# Patient Record
Sex: Female | Born: 1995 | Race: White | Hispanic: Yes | Marital: Single | Smoking: Never smoker
Health system: Southern US, Community
[De-identification: ages and names within clinical notes are randomized; demographics above are authoritative.]

## PROBLEM LIST (undated history)

## (undated) ENCOUNTER — Inpatient Hospital Stay (HOSPITAL_COMMUNITY): Payer: Self-pay

## (undated) DIAGNOSIS — Z789 Other specified health status: Secondary | ICD-10-CM

## (undated) HISTORY — PX: NO PAST SURGERIES: SHX2092

---
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
## 2013-02-28 ENCOUNTER — Encounter (HOSPITAL_COMMUNITY): Payer: Self-pay

## 2013-02-28 ENCOUNTER — Emergency Department (HOSPITAL_COMMUNITY)
Admission: EM | Admit: 2013-02-28 | Discharge: 2013-03-01 | Disposition: A | Discharge status: Home / Self Care | Payer: Self-pay | Attending: Emergency Medicine | Admitting: Emergency Medicine

## 2013-02-28 DIAGNOSIS — T7840XA Allergy, unspecified, initial encounter: Secondary | ICD-10-CM

## 2013-02-28 DIAGNOSIS — Z79899 Other long term (current) drug therapy: Secondary | ICD-10-CM | POA: Insufficient documentation

## 2013-02-28 DIAGNOSIS — L500 Allergic urticaria: Secondary | ICD-10-CM | POA: Insufficient documentation

## 2013-02-28 MED ORDER — DIPHENHYDRAMINE HCL 25 MG PO CAPS: 25.0000 mg | ORAL_CAPSULE | Freq: Once | ORAL | Status: DC

## 2013-02-28 MED ORDER — DIPHENHYDRAMINE HCL 50 MG/ML IJ SOLN
INTRAMUSCULAR | Status: AC
  Administered 2013-02-28: 25 mg via INTRAVENOUS
  Filled 2013-02-28: qty 1

## 2013-02-28 MED ORDER — EPINEPHRINE 0.3 MG/0.3ML IJ DEVI: 0.3000 mg | Freq: Once | INTRAMUSCULAR | Status: DC

## 2013-02-28 MED ORDER — FAMOTIDINE IN NACL 20-0.9 MG/50ML-% IV SOLN
20.0000 mg | Freq: Once | INTRAVENOUS | Status: AC
  Administered 2013-02-28: 20 mg via INTRAVENOUS
  Filled 2013-02-28: qty 50

## 2013-02-28 MED ORDER — DIPHENHYDRAMINE HCL 50 MG/ML IJ SOLN
25.0000 mg | Freq: Once | INTRAMUSCULAR | Status: AC
  Administered 2013-02-28: 25 mg via INTRAVENOUS

## 2013-02-28 MED ORDER — EPINEPHRINE 0.15 MG/0.3ML IJ DEVI
INTRAMUSCULAR | Status: AC
  Filled 2013-02-28: qty 0.3

## 2013-02-28 MED ORDER — METHYLPREDNISOLONE SODIUM SUCC 125 MG IJ SOLR
125.0000 mg | Freq: Once | INTRAMUSCULAR | Status: AC
  Administered 2013-02-28: 125 mg via INTRAVENOUS
  Filled 2013-02-28: qty 2

## 2013-02-28 MED ORDER — SODIUM CHLORIDE 0.9 % IV BOLUS (SEPSIS)
1000.0000 mL | Freq: Once | INTRAVENOUS | Status: AC
  Administered 2013-02-28: 1000 mL via INTRAVENOUS

## 2013-02-28 NOTE — ED Notes (Addendum)
Hives have decreased greatly since administration of benadryl. Pt is in no distress. Pt placed on pulse ox and BP cuff. Denies sob or feeling like throat is swollen.

## 2013-02-28 NOTE — ED Notes (Signed)
Pt presents to ED with hives to her upper torso, upper back, BUE, neck and face. Pt reports eating shrimp approx 1330 today, has eaten shrimp in the past and never had am allergic reaction

## 2013-03-01 MED ORDER — FAMOTIDINE 20 MG PO TABS: 20.0000 mg | ORAL_TABLET | Freq: Two times a day (BID) | ORAL | Status: DC

## 2013-03-01 MED ORDER — PREDNISONE 50 MG PO TABS: 50.0000 mg | ORAL_TABLET | Freq: Every day | ORAL | Status: DC

## 2013-03-01 NOTE — ED Provider Notes (Signed)
History     CSN: [id number]  Arrival date & time 02/28/13  2214   First MD Initiated Contact with Patient 02/28/13 2245      Chief Complaint  Patient presents with  . Allergic Reaction    (Consider location/radiation/quality/duration/timing/severity/associated sxs/prior treatment) HPI Patient presents emergency department with hives and itching that began just prior to arrival.  Patient, states she was doing laundry at that time.  Patient, states, that around 6:00 tonight she ate shrimp. Patient's symptoms started around 10:00pm.  Patient denies wheezing, chest pain, shortness of breath, difficulty swallowing, difficulty breathing, tongue swelling, or syncope.  Patient, states, that she did not use them prior to arrival.  History reviewed. No pertinent past medical history.  History reviewed. No pertinent past surgical history.  History reviewed. No pertinent family history.  History  Substance Use Topics  . Smoking status: Never Smoker   . Smokeless tobacco: Not on file  . Alcohol Use: No    OB History   Grav Para Term Preterm Abortions TAB SAB Ect Mult Living                  Review of Systems All other systems negative except as documented in the HPI. All pertinent positives and negatives as reviewed in the HPI.  Allergies  Review of patient's allergies indicates no known allergies.  Home Medications   Current Outpatient Rx  Name  Route  Sig  Dispense  Refill  . ferrous sulfate 325 (65 FE) MG tablet   Oral   Take 325 mg by mouth daily with breakfast.         . ibuprofen (ADVIL,MOTRIN) 200 MG tablet   Oral   Take 400 mg by mouth every 6 (six) hours as needed for pain or headache.           BP 97/59  Pulse 65  Temp(Src) 97.7 F (36.5 C) (Oral)  Resp 16  SpO2 100%  LMP 02/11/2013  Physical Exam  Nursing note and vitals reviewed. Constitutional: She is oriented to person, place, and time. She appears well-developed and well-nourished. No distress.   HENT:  Head: Normocephalic.  Mouth/Throat: Oropharynx is clear and moist.  Eyes: Pupils are equal, round, and reactive to light.  Neck: Normal range of motion.  Cardiovascular: Normal rate, regular rhythm and normal heart sounds.  Exam reveals no gallop and no friction rub.   No murmur heard. Pulmonary/Chest: Effort normal and breath sounds normal. No respiratory distress. She has no wheezes.  Neurological: She is alert and oriented to person, place, and time.  Skin: Skin is warm and dry. Rash noted.  Patient had hives covering the majority of her body. Upon my second evaluation of the patient after medication administration her rash is completely resolved.    ED Course  Procedures (including critical care time)  Patient received Solu-Medrol, Benadryl, and Pepcid and is completely resolved of all symptoms.  Patient has been rechecked x3 on each assessment no rash is noted. Patient is not having respiratory distress and advised patient to followup with her primary care Dr. Geronimo Running advised to return here for any worsening in her condition.   MDM  MDM Reviewed: nursing note and vitals            Carlyle Dolly, PA-C 03/01/13 0041

## 2013-03-01 NOTE — ED Provider Notes (Signed)
Medical screening examination/treatment/procedure(s) were performed by non-physician practitioner and as supervising physician I was immediately available for consultation/collaboration.  Addilee Neu, MD 03/01/13 0612 

## 2013-07-18 ENCOUNTER — Inpatient Hospital Stay (HOSPITAL_COMMUNITY)
Admission: AD | Admit: 2013-07-18 | Discharge: 2013-07-18 | Disposition: A | Discharge status: Home / Self Care | Payer: Self-pay | Source: Ambulatory Visit | Attending: Family Medicine | Admitting: Family Medicine

## 2013-07-18 ENCOUNTER — Encounter (HOSPITAL_COMMUNITY): Payer: Self-pay | Admitting: *Deleted

## 2013-07-18 ENCOUNTER — Inpatient Hospital Stay (HOSPITAL_COMMUNITY): Payer: Medicaid Other

## 2013-07-18 DIAGNOSIS — O34599 Maternal care for other abnormalities of gravid uterus, unspecified trimester: Secondary | ICD-10-CM | POA: Insufficient documentation

## 2013-07-18 DIAGNOSIS — O4690 Antepartum hemorrhage, unspecified, unspecified trimester: Secondary | ICD-10-CM

## 2013-07-18 DIAGNOSIS — N831 Corpus luteum cyst of ovary, unspecified side: Secondary | ICD-10-CM | POA: Insufficient documentation

## 2013-07-18 DIAGNOSIS — O209 Hemorrhage in early pregnancy, unspecified: Secondary | ICD-10-CM | POA: Insufficient documentation

## 2013-07-18 HISTORY — DX: Other specified health status: Z78.9

## 2013-07-18 LAB — POCT PREGNANCY, URINE: Preg Test, Ur: POSITIVE — AB

## 2013-07-18 LAB — URINE MICROSCOPIC-ADD ON

## 2013-07-18 LAB — URINALYSIS, ROUTINE W REFLEX MICROSCOPIC
Nitrite: NEGATIVE
Specific Gravity, Urine: 1.01 (ref 1.005–1.030)
Urobilinogen, UA: 0.2 mg/dL (ref 0.0–1.0)

## 2013-07-18 LAB — WET PREP, GENITAL
Clue Cells Wet Prep HPF POC: NONE SEEN
Trich, Wet Prep: NONE SEEN
Yeast Wet Prep HPF POC: NONE SEEN

## 2013-07-18 NOTE — MAU Provider Note (Signed)
History     CSN: [id number]  Arrival date and time: 07/18/13 2004   First Provider Initiated Contact with Patient 07/18/13 2117      Chief Complaint  Patient presents with  . Vaginal Bleeding   HPI This is a 17 y.o. female at [redacted]w[redacted]d by LMP who presents with c/o positive pregnancy test and light bleeding. Started brown yesterday and got red today. No pain.   RN Note  Pt reports 2 positive home preg test, over the last few days she had some bleeding. Started as brownish discharge and is now red and a little heavier than spotting. Denies pain.       OB History   Grav Para Term Preterm Abortions TAB SAB Ect Mult Living   2    1 1           Past Medical History  Diagnosis Date  . Medical history non-contributory     Past Surgical History  Procedure Laterality Date  . No past surgeries      No family history on file.  History  Substance Use Topics  . Smoking status: Never Smoker   . Smokeless tobacco: Not on file  . Alcohol Use: No    Allergies: No Known Allergies  Prescriptions prior to admission  Medication Sig Dispense Refill  . [DISCONTINUED] famotidine (PEPCID) 20 MG tablet Take 1 tablet (20 mg total) by mouth 2 (two) times daily.  10 tablet  0  . [DISCONTINUED] ferrous sulfate 325 (65 FE) MG tablet Take 325 mg by mouth daily with breakfast.      . [DISCONTINUED] ibuprofen (ADVIL,MOTRIN) 200 MG tablet Take 400 mg by mouth every 6 (six) hours as needed for pain or headache.      . [DISCONTINUED] predniSONE (DELTASONE) 50 MG tablet Take 1 tablet (50 mg total) by mouth daily.  5 tablet  0    Review of Systems  Constitutional: Negative for fever and malaise/fatigue.  Gastrointestinal: Negative for nausea, vomiting, abdominal pain, diarrhea and constipation.  Genitourinary:       Spotting   Neurological: Negative for dizziness and headaches.   Physical Exam   Blood pressure 111/63, pulse 75, temperature 98.5 F (36.9 C), temperature source Oral, resp. rate  18, last menstrual period 06/02/2013, SpO2 100.00%.  Physical Exam  Constitutional: She is oriented to person, place, and time. She appears well-developed and well-nourished. No distress.  HENT:  Head: Normocephalic.  Cardiovascular: Normal rate.   Respiratory: Effort normal.  GI: Soft. There is no tenderness. There is no rebound and no guarding.  Genitourinary: Uterus normal. Vaginal discharge (small to moderate blood in vault, cervix long and closed, uterus small, adnexae nontender) found.  Musculoskeletal: Normal range of motion.  Neurological: She is alert and oriented to person, place, and time.  Skin: Skin is warm and dry.  Psychiatric: She has a normal mood and affect.    MAU Course  Procedures  MDM Results for orders placed during the hospital encounter of 07/18/13 (from the past 24 hour(s))  URINALYSIS, ROUTINE W REFLEX MICROSCOPIC     Status: Abnormal   Collection Time    07/18/13  8:25 PM      Result Value Range   Color, Urine YELLOW  YELLOW   APPearance CLEAR  CLEAR   Specific Gravity, Urine 1.010  1.005 - 1.030   pH 6.0  5.0 - 8.0   Glucose, UA NEGATIVE  NEGATIVE mg/dL   Hgb urine dipstick LARGE (*) NEGATIVE   Bilirubin Urine NEGATIVE  NEGATIVE   Ketones, ur NEGATIVE  NEGATIVE mg/dL   Protein, ur NEGATIVE  NEGATIVE mg/dL   Urobilinogen, UA 0.2  0.0 - 1.0 mg/dL   Nitrite NEGATIVE  NEGATIVE   Leukocytes, UA NEGATIVE  NEGATIVE  URINE MICROSCOPIC-ADD ON     Status: Abnormal   Collection Time    07/18/13  8:25 PM      Result Value Range   Squamous Epithelial / LPF FEW (*) RARE   WBC, UA 0-2  <3 WBC/hpf   RBC / HPF 3-6  <3 RBC/hpf   Bacteria, UA FEW (*) RARE   Urine-Other RARE YEAST    POCT PREGNANCY, URINE     Status: Abnormal   Collection Time    07/18/13  8:32 PM      Result Value Range   Preg Test, Ur POSITIVE (*) NEGATIVE  HCG, QUANTITATIVE, PREGNANCY     Status: Abnormal   Collection Time    07/18/13  9:20 PM      Result Value Range   hCG, Beta  Chain, Quant, S 410 (*) <5 mIU/mL  ABO/RH     Status: None   Collection Time    07/18/13  9:20 PM      Result Value Range   ABO/RH(D) O POS    WET PREP, GENITAL     Status: Abnormal   Collection Time    07/18/13  9:25 PM      Result Value Range   Yeast Wet Prep HPF POC NONE SEEN  NONE SEEN   Trich, Wet Prep NONE SEEN  NONE SEEN   Clue Cells Wet Prep HPF POC NONE SEEN  NONE SEEN   WBC, Wet Prep HPF POC FEW (*) NONE SEEN     US Ob Transvaginal  07/18/2013   *RADIOLOGY REPORT*  Clinical Data: Vaginal bleeding.  Quantitative beta HCG is pending. By LMP, the patient is 6 weeks 4 days.  EDC by LMP is 03/09/2014.  OBSTETRIC <14 WK [place] AND TRANSVAGINAL OB US  Technique:  Both transabdominal and transvaginal ultrasound examinations were performed for complete evaluation of the gestation as well as the maternal uterus, adnexal regions, and pelvic cul-de-sac.  Transvaginal technique was performed to assess early pregnancy.  Comparison:  None.    Intrauterine gestational sac:  Present Yolk sac: Not seen Embryo: Not seen Cardiac Activity: Not seen  MSD: 4.7 mm  5 w 0 d  Maternal uterus/adnexae: No subchorionic hemorrhage identified.  Right corpus luteum cyst is 1.0 x 0.6 x 0.8 cm.  Left ovary has a normal appearance.  No free pelvic fluid.    IMPRESSION:  1.  Intrauterine sac like structure, highly likely to represent an intrauterine gestational sac.  No yolk sac or fetal pole identified at this time. 2.  Follow-up ultrasound is suggested in 10 - 14 days to assess interval growth and for dating purposes.     Original Report Authenticated By: Norva Pavlov, M.D.    Assessment and Plan  A:  Pregnancy at [redacted]w[redacted]d       Gestational sac with Quant at 410, cannot rule out ectopic yet  P:  Discharge home with ectopic precautions       Return in 48 hrs for quant and repeat US in a week  [facility] 07/18/2013, 9:34 PM

## 2013-07-18 NOTE — MAU Note (Signed)
Pt reports 2 positive home preg test, over the last few days she had some bleeding. Started as brownish discharge and is now red and a little heavier than spotting. Denies pain.

## 2013-07-19 LAB — GC/CHLAMYDIA PROBE AMP
CT Probe RNA: NEGATIVE
GC Probe RNA: NEGATIVE

## 2013-07-19 NOTE — MAU Provider Note (Signed)
Chart reviewed and agree with management and plan.  

## 2013-07-20 ENCOUNTER — Inpatient Hospital Stay (HOSPITAL_COMMUNITY)
Admission: AD | Admit: 2013-07-20 | Discharge: 2013-07-20 | Disposition: A | Discharge status: Home / Self Care | Payer: Self-pay | Source: Ambulatory Visit | Attending: Obstetrics & Gynecology | Admitting: Obstetrics & Gynecology

## 2013-07-20 ENCOUNTER — Encounter (HOSPITAL_COMMUNITY): Payer: Self-pay

## 2013-07-20 DIAGNOSIS — O039 Complete or unspecified spontaneous abortion without complication: Secondary | ICD-10-CM

## 2013-07-20 DIAGNOSIS — O034 Incomplete spontaneous abortion without complication: Secondary | ICD-10-CM | POA: Insufficient documentation

## 2013-07-20 NOTE — MAU Provider Note (Signed)
  History     CSN: [id number]  Arrival date and time: 07/20/13 1406   None     Chief Complaint  Patient presents with  . Follow-up   HPI  Pt is a G2P0010 at [redacted]w[redacted]d weeks IUP here for follow-up BHCG.  Seen on 9/9 for vaginal bleeding.  BHCG 410.  Blood type O+.  Ultrasound showed IUGS, no yolk sac.  Pt report continued bleeding.  Bleeding is described as light.  No report of pelvic pain.     Past Medical History  Diagnosis Date  . Medical history non-contributory     Past Surgical History  Procedure Laterality Date  . No past surgeries      No family history on file.  History  Substance Use Topics  . Smoking status: Never Smoker   . Smokeless tobacco: Not on file  . Alcohol Use: No    Allergies: No Known Allergies  No prescriptions prior to admission    Review of Systems  Genitourinary:       Vaginal bleeding  All other systems reviewed and are negative.   Physical Exam   Blood pressure 123/67, pulse 95, temperature 98.4 F (36.9 C), temperature source Oral, resp. rate 16, last menstrual period 06/02/2013, SpO2 99.00%.  Physical Exam  Constitutional: She is oriented to person, place, and time. She appears well-developed and well-nourished. No distress.  HENT:  Head: Normocephalic.  Neck: Normal range of motion. Neck supple.  Neurological: She is alert and oriented to person, place, and time. She has normal reflexes.  Skin: Skin is warm and dry.    MAU Course  Procedures Results for orders placed during the hospital encounter of 07/20/13 (from the past 24 hour(s))  HCG, QUANTITATIVE, PREGNANCY     Status: Abnormal   Collection Time    07/20/13  2:16 PM      Result Value Range   hCG, Beta Chain, Quant, S 104 (*) <5 mIU/mL   Consulted with Dr. Penne Lash > reviewed HPI/labs/exam  Assessment and Plan  Incomplete Miscarriage  Plan: Discharge to home Repeat BHCG in one week Ectopic precautions  [facility] 07/20/2013, 3:43 PM

## 2013-07-20 NOTE — MAU Note (Signed)
Patient to MAU for repeat BHCG. Patient states she is having light bleeding with small clots. States she has some mid to upper abdominal pain off and on. No cramping.

## 2013-07-24 NOTE — MAU Provider Note (Signed)
Attestation of Attending Supervision of Advanced Practitioner (CNM/NP): Evaluation and management procedures were performed by the Advanced Practitioner under my supervision and collaboration. I have reviewed the Advanced Practitioner's note and chart, and I agree with the management and plan.  LEGGETT,KELLY H. 4:38 PM

## 2013-07-25 ENCOUNTER — Ambulatory Visit (HOSPITAL_COMMUNITY): Payer: Self-pay | Attending: Advanced Practice Midwife

## 2013-07-27 ENCOUNTER — Other Ambulatory Visit: Payer: Self-pay

## 2013-10-04 ENCOUNTER — Encounter (HOSPITAL_COMMUNITY): Payer: Self-pay | Admitting: Emergency Medicine

## 2013-10-04 DIAGNOSIS — G43909 Migraine, unspecified, not intractable, without status migrainosus: Secondary | ICD-10-CM | POA: Insufficient documentation

## 2013-10-04 DIAGNOSIS — Z79899 Other long term (current) drug therapy: Secondary | ICD-10-CM | POA: Insufficient documentation

## 2013-10-04 DIAGNOSIS — Z3202 Encounter for pregnancy test, result negative: Secondary | ICD-10-CM | POA: Insufficient documentation

## 2013-10-04 NOTE — ED Notes (Signed)
Pt. reports persistent headache for 2 days unrelieved by OTC pain medication , nausea and vomitted twice , denies head injury. No fever or chills , denies neck stiffness.

## 2013-10-05 ENCOUNTER — Emergency Department (HOSPITAL_COMMUNITY)
Admission: EM | Admit: 2013-10-05 | Discharge: 2013-10-05 | Disposition: A | Discharge status: Home / Self Care | Payer: Medicaid Other | Attending: Emergency Medicine | Admitting: Emergency Medicine

## 2013-10-05 DIAGNOSIS — G43909 Migraine, unspecified, not intractable, without status migrainosus: Secondary | ICD-10-CM

## 2013-10-05 MED ORDER — SODIUM CHLORIDE 0.9 % IV BOLUS (SEPSIS)
1000.0000 mL | Freq: Once | INTRAVENOUS | Status: AC
  Administered 2013-10-05: 1000 mL via INTRAVENOUS

## 2013-10-05 MED ORDER — DIPHENHYDRAMINE HCL 50 MG/ML IJ SOLN
25.0000 mg | Freq: Once | INTRAMUSCULAR | Status: AC
  Administered 2013-10-05: 25 mg via INTRAVENOUS
  Filled 2013-10-05: qty 1

## 2013-10-05 MED ORDER — DEXAMETHASONE SODIUM PHOSPHATE 10 MG/ML IJ SOLN
10.0000 mg | Freq: Once | INTRAMUSCULAR | Status: AC
  Administered 2013-10-05: 10 mg via INTRAVENOUS
  Filled 2013-10-05: qty 1

## 2013-10-05 MED ORDER — METOCLOPRAMIDE HCL 5 MG/ML IJ SOLN
10.0000 mg | Freq: Once | INTRAMUSCULAR | Status: AC
  Administered 2013-10-05: 10 mg via INTRAVENOUS
  Filled 2013-10-05: qty 2

## 2013-10-05 NOTE — ED Provider Notes (Signed)
CSN: [id number]     Arrival date & time 10/04/13  2242 History   First MD Initiated Contact with Patient 10/05/13 0146     Chief Complaint  Patient presents with  . Headache   (Consider location/radiation/quality/duration/timing/severity/associated sxs/prior Treatment) Patient is a 17 y.o. female presenting with headaches.  Headache Pain location:  L temporal and frontal Quality:  Dull Severity currently:  7/10 Severity at highest:  10/10 Onset quality:  Gradual Duration:  2 days Timing:  Constant Progression:  Waxing and waning Chronicity:  New Relieved by:  Nothing Worsened by:  Sound Ineffective treatments:  Acetaminophen and NSAIDs Associated symptoms: no abdominal pain, no back pain, no blurred vision, no congestion, no cough, no diarrhea, no fatigue, no fever, no focal weakness, no loss of balance, no myalgias, no nausea, no near-syncope, no neck pain, no neck stiffness, no numbness, no paresthesias, no photophobia, no seizures, no sore throat, no visual change, no vomiting and no weakness     Past Medical History  Diagnosis Date  . Medical history non-contributory    Past Surgical History  Procedure Laterality Date  . No past surgeries     No family history on file. History  Substance Use Topics  . Smoking status: Never Smoker   . Smokeless tobacco: Not on file  . Alcohol Use: No   OB History   Grav Para Term Preterm Abortions TAB SAB Ect Mult Living   2    1 1          Review of Systems  Constitutional: Negative for fever, chills, diaphoresis, activity change, appetite change and fatigue.  HENT: Negative for congestion, facial swelling, rhinorrhea and sore throat.   Eyes: Negative for blurred vision, photophobia and discharge.  Respiratory: Negative for cough, chest tightness and shortness of breath.   Cardiovascular: Negative for chest pain, palpitations, leg swelling and near-syncope.  Gastrointestinal: Negative for nausea, vomiting, abdominal pain and  diarrhea.  Endocrine: Negative for polydipsia and polyuria.  Genitourinary: Negative for dysuria, frequency, difficulty urinating and pelvic pain.  Musculoskeletal: Negative for arthralgias, back pain, myalgias, neck pain and neck stiffness.  Skin: Negative for color change and wound.  Allergic/Immunologic: Negative for immunocompromised state.  Neurological: Positive for headaches. Negative for focal weakness, seizures, facial asymmetry, weakness, numbness, paresthesias and loss of balance.  Hematological: Does not bruise/bleed easily.  Psychiatric/Behavioral: Negative for confusion and agitation.    Allergies  Shrimp  Home Medications   Current Outpatient Rx  Name  Route  Sig  Dispense  Refill  . Ascorbic Acid (VITAMIN C PO)   Oral   Take 1 tablet by mouth daily.          BP 100/64  Pulse 74  Temp(Src) 98.2 F (36.8 C) (Oral)  Resp 15  SpO2 98%  LMP 08/20/2013 Physical Exam  Constitutional: She is oriented to person, place, and time. She appears well-developed and well-nourished. No distress.  HENT:  Head: Normocephalic and atraumatic.  Mouth/Throat: No oropharyngeal exudate.  Eyes: Pupils are equal, round, and reactive to light.  Neck: Normal range of motion. Neck supple.  Cardiovascular: Normal rate, regular rhythm and normal heart sounds.  Exam reveals no gallop and no friction rub.   No murmur heard. Pulmonary/Chest: Effort normal and breath sounds normal. No respiratory distress. She has no wheezes. She has no rales.  Abdominal: Soft. Bowel sounds are normal. She exhibits no distension and no mass. There is no tenderness. There is no rebound and no guarding.  Musculoskeletal: Normal range  of motion. She exhibits no edema and no tenderness.  Neurological: She is alert and oriented to person, place, and time. She has normal strength. She displays no tremor. No cranial nerve deficit or sensory deficit. She exhibits normal muscle tone. She displays a negative Romberg  sign. Coordination and gait normal. GCS eye subscore is 4. GCS verbal subscore is 5. GCS motor subscore is 6.  Skin: Skin is warm and dry.  Psychiatric: She has a normal mood and affect.    ED Course  Procedures (including critical care time) Labs Review Labs Reviewed  POCT PREGNANCY, URINE   Imaging Review No results found.  EKG Interpretation   None       MDM   1. Migraine    Pt is a 17 y.o. female with Pmhx as above who presents with 2 days of constant, waxin/waning h/a, w/ assoc phonophobia.  No fever, chills, numbness, weakness.  H/a originated behind L eye, now is frontal.  Hx of similar episodes in the past, but does not have freuqent h/a.  On PE, VSS, pt in NAD. Neuro exam unremakrable.  I suspect migraine, doubt SAH, meningitis. Symptoms resolved after migraine cocktail. Return precautions given for new or worsening symptoms including worsening pain, fever, numbness, weakness         Shelley Cisco, MD 10/05/13 2122

## 2013-10-05 NOTE — ED Notes (Signed)
MD at bedside. 

## 2013-11-09 NOTE — L&D Delivery Note (Signed)
  Delivery Note At 1:31 AM a viable and healthy female was delivered via Vaginal, Spontaneous Delivery (Presentation: Right Occiput Anterior with presenting right hand).  APGAR: 8, 9; weight pending .   Placenta status: Intact, Spontaneous.  Cord: 3 vessels with the following complications: None.    Anesthesia: Local  Episiotomy: none Lacerations: 1st degree right and left labial, hemostatic, not repaired Suture Repair: n/a Est. Blood Loss (mL): 350  Mom to postpartum.  Baby to Couplet care / Skin to Skin.  18 y.o. G2P0010 admitted at 978w6d, delivered at 7249w0d, admitted for active labor at term.  Progressed slowly with only AROM for augmentation to 7 cm with baby in OP position.  Maternal position changed multiple times and pt changed to 9 cm with full rotation of head, then quickly to 10 cm and pushed less than 10 minutes to deliver infant.  Infant placed in mother's arms immediately after delivery.  Cord clamping delayed by several minutes, then clamped by CNM and cut by FOB.  Placenta spontaneous and intact.  IV noted to be infiltrated after delivery so Pitocin given IM.  Mom and baby stable and infant skin to skin at least 1 hour.   [name], Sande Pickert 08/26/2014, 2:00 AM

## 2013-11-09 NOTE — L&D Delivery Note (Signed)
Attestation of Attending Supervision of Advanced Practitioner (CNM/NP): Evaluation and management procedures were performed by the Advanced Practitioner under my supervision and collaboration.  I have reviewed the Advanced Practitioner's note and chart, and I agree with the management and plan.  Margeaux Swantek 08/26/2014 7:50 AM

## 2014-02-12 LAB — OB RESULTS CONSOLE ABO/RH: RH TYPE: POSITIVE

## 2014-02-12 LAB — OB RESULTS CONSOLE RPR: RPR: NONREACTIVE

## 2014-02-12 LAB — OB RESULTS CONSOLE HEPATITIS B SURFACE ANTIGEN: Hepatitis B Surface Ag: NEGATIVE

## 2014-02-12 LAB — OB RESULTS CONSOLE GC/CHLAMYDIA
CHLAMYDIA, DNA PROBE: NEGATIVE
Gonorrhea: NEGATIVE

## 2014-02-12 LAB — OB RESULTS CONSOLE HIV ANTIBODY (ROUTINE TESTING): HIV: NONREACTIVE

## 2014-02-12 LAB — OB RESULTS CONSOLE ANTIBODY SCREEN: Antibody Screen: NEGATIVE

## 2014-02-12 LAB — OB RESULTS CONSOLE RUBELLA ANTIBODY, IGM: Rubella: IMMUNE

## 2014-02-13 ENCOUNTER — Other Ambulatory Visit (HOSPITAL_COMMUNITY): Payer: Self-pay | Admitting: Nurse Practitioner

## 2014-02-13 DIAGNOSIS — O26849 Uterine size-date discrepancy, unspecified trimester: Secondary | ICD-10-CM

## 2014-02-13 DIAGNOSIS — Z3682 Encounter for antenatal screening for nuchal translucency: Secondary | ICD-10-CM

## 2014-02-20 ENCOUNTER — Ambulatory Visit (HOSPITAL_COMMUNITY)
Admission: RE | Admit: 2014-02-20 | Discharge: 2014-02-20 | Disposition: A | Discharge status: Home / Self Care | Payer: Medicaid Other | Source: Ambulatory Visit | Attending: Nurse Practitioner | Admitting: Nurse Practitioner

## 2014-02-20 ENCOUNTER — Other Ambulatory Visit: Payer: Self-pay

## 2014-02-20 ENCOUNTER — Encounter (HOSPITAL_COMMUNITY): Payer: Self-pay

## 2014-02-20 DIAGNOSIS — O351XX0 Maternal care for (suspected) chromosomal abnormality in fetus, not applicable or unspecified: Secondary | ICD-10-CM | POA: Insufficient documentation

## 2014-02-20 DIAGNOSIS — Z3689 Encounter for other specified antenatal screening: Secondary | ICD-10-CM | POA: Insufficient documentation

## 2014-02-20 DIAGNOSIS — O3510X0 Maternal care for (suspected) chromosomal abnormality in fetus, unspecified, not applicable or unspecified: Secondary | ICD-10-CM | POA: Insufficient documentation

## 2014-02-20 DIAGNOSIS — Z3682 Encounter for antenatal screening for nuchal translucency: Secondary | ICD-10-CM

## 2014-02-20 DIAGNOSIS — O26849 Uterine size-date discrepancy, unspecified trimester: Secondary | ICD-10-CM

## 2014-02-20 NOTE — Progress Notes (Signed)
[facility] ultrasound  Indication: 18 yr old G2P0010 at 2124w2d for first trimester screen.  Findings: 1. Single intrauterine pregnancy. 2. Fetal crown rump length is consistent with dating. 3. Normal uterus; no adnexal masses seen. 4. Evaluation of fetal anatomy is limited by early gestational age. 5. Normal nuchal translucency measuring 2.1101mm. 6. The nasal bone is indeterminate.  Recommendations: 1. First trimester screen done today: - discussed limitations of screening tests in detecting fetal aneuploidy 2. Recommend maternal serum AFP at 15-20 weeks 3. Recommend fetal anatomic survey at 18-[redacted] weeks gestation  Eulis FosterKristen Chrisie Jankovich, MD

## 2014-02-21 ENCOUNTER — Ambulatory Visit (HOSPITAL_COMMUNITY): Payer: Medicaid Other

## 2014-03-12 ENCOUNTER — Other Ambulatory Visit (HOSPITAL_COMMUNITY): Payer: Self-pay | Admitting: Nurse Practitioner

## 2014-03-12 DIAGNOSIS — Z3689 Encounter for other specified antenatal screening: Secondary | ICD-10-CM

## 2014-04-03 ENCOUNTER — Ambulatory Visit (HOSPITAL_COMMUNITY)
Admission: RE | Admit: 2014-04-03 | Discharge: 2014-04-03 | Disposition: A | Discharge status: Home / Self Care | Payer: Medicaid Other | Source: Ambulatory Visit | Attending: Nurse Practitioner | Admitting: Nurse Practitioner

## 2014-04-03 DIAGNOSIS — Z3689 Encounter for other specified antenatal screening: Secondary | ICD-10-CM

## 2014-07-30 LAB — OB RESULTS CONSOLE GBS: STREP GROUP B AG: NEGATIVE

## 2014-08-24 ENCOUNTER — Encounter (HOSPITAL_COMMUNITY): Payer: Self-pay | Admitting: *Deleted

## 2014-08-24 ENCOUNTER — Inpatient Hospital Stay (HOSPITAL_COMMUNITY)
Admission: AD | Admit: 2014-08-24 | Discharge: 2014-08-24 | Disposition: A | Discharge status: Home / Self Care | Payer: Medicaid Other | Source: Ambulatory Visit | Attending: Family Medicine | Admitting: Family Medicine

## 2014-08-24 NOTE — Progress Notes (Signed)
Reviewed D/C instructions with pt and family.  RTC if contractions increase in strength, bleeding, possible ROM and/or decreased fetal movement. Pt and family verbalize understanding.  Copy of D/C instructions given to pt.  Keep appt with Dr Denyce RobertStinson's office on Monday.

## 2014-08-24 NOTE — MAU Note (Signed)
Patient states she has been having contractions every 5-6 minutes with mucus discharge. Denies leaking fluid, has a brown discharge. Reports good fetal movement.

## 2014-08-24 NOTE — MAU Note (Signed)
Patient will be assessed for labor in room 164. Report given to Lollie MarrowBobbi Jo Prichard, CCC/BS/

## 2014-08-25 ENCOUNTER — Inpatient Hospital Stay (HOSPITAL_COMMUNITY)
Admission: AD | Admit: 2014-08-25 | Discharge: 2014-08-27 | DRG: 775 | Disposition: A | Discharge status: Home / Self Care | Payer: Medicaid Other | Source: Ambulatory Visit | Attending: Obstetrics and Gynecology | Admitting: Obstetrics and Gynecology

## 2014-08-25 ENCOUNTER — Encounter (HOSPITAL_COMMUNITY): Payer: Self-pay | Admitting: *Deleted

## 2014-08-25 ENCOUNTER — Encounter (HOSPITAL_COMMUNITY): Payer: Self-pay

## 2014-08-25 ENCOUNTER — Inpatient Hospital Stay (HOSPITAL_COMMUNITY)
Admission: AD | Admit: 2014-08-25 | Discharge: 2014-08-25 | Disposition: A | Discharge status: Home / Self Care | Payer: Medicaid Other | Source: Ambulatory Visit | Attending: Family Medicine | Admitting: Family Medicine

## 2014-08-25 DIAGNOSIS — O471 False labor at or after 37 completed weeks of gestation: Secondary | ICD-10-CM | POA: Diagnosis present

## 2014-08-25 DIAGNOSIS — Z3A39 39 weeks gestation of pregnancy: Secondary | ICD-10-CM | POA: Insufficient documentation

## 2014-08-25 DIAGNOSIS — IMO0001 Reserved for inherently not codable concepts without codable children: Secondary | ICD-10-CM

## 2014-08-25 LAB — TYPE AND SCREEN
ABO/RH(D): O POS
ANTIBODY SCREEN: NEGATIVE

## 2014-08-25 LAB — RPR

## 2014-08-25 LAB — CBC
HCT: 31.8 % — ABNORMAL LOW (ref 36.0–46.0)
HEMOGLOBIN: 11.2 g/dL — AB (ref 12.0–15.0)
MCH: 30.3 pg (ref 26.0–34.0)
MCHC: 35.2 g/dL (ref 30.0–36.0)
MCV: 85.9 fL (ref 78.0–100.0)
Platelets: 370 10*3/uL (ref 150–400)
RBC: 3.7 MIL/uL — AB (ref 3.87–5.11)
RDW: 13.7 % (ref 11.5–15.5)
WBC: 14.5 10*3/uL — AB (ref 4.0–10.5)

## 2014-08-25 LAB — HIV ANTIBODY (ROUTINE TESTING W REFLEX): HIV 1&2 Ab, 4th Generation: NONREACTIVE

## 2014-08-25 MED ORDER — EPHEDRINE 5 MG/ML INJ
10.0000 mg | INTRAVENOUS | Status: DC | PRN
  Filled 2014-08-25: qty 2

## 2014-08-25 MED ORDER — LACTATED RINGERS IV SOLN
INTRAVENOUS | Status: DC
  Administered 2014-08-25 (×2): via INTRAVENOUS

## 2014-08-25 MED ORDER — ACETAMINOPHEN 325 MG PO TABS: 650.0000 mg | ORAL_TABLET | ORAL | Status: DC | PRN

## 2014-08-25 MED ORDER — FENTANYL CITRATE 0.05 MG/ML IJ SOLN
100.0000 ug | INTRAMUSCULAR | Status: DC | PRN
  Administered 2014-08-25 – 2014-08-26 (×2): 100 ug via INTRAVENOUS
  Filled 2014-08-25 (×2): qty 2

## 2014-08-25 MED ORDER — OXYCODONE-ACETAMINOPHEN 5-325 MG PO TABS
2.0000 | ORAL_TABLET | ORAL | Status: DC | PRN
Start: 2014-08-25 — End: 2014-08-26

## 2014-08-25 MED ORDER — OXYCODONE-ACETAMINOPHEN 5-325 MG PO TABS: 1.0000 | ORAL_TABLET | ORAL | Status: DC | PRN

## 2014-08-25 MED ORDER — PHENYLEPHRINE 40 MCG/ML (10ML) SYRINGE FOR IV PUSH (FOR BLOOD PRESSURE SUPPORT)
80.0000 ug | PREFILLED_SYRINGE | INTRAVENOUS | Status: DC | PRN
  Filled 2014-08-25: qty 2

## 2014-08-25 MED ORDER — LACTATED RINGERS IV SOLN: 500.0000 mL | INTRAVENOUS | Status: DC | PRN

## 2014-08-25 MED ORDER — DIPHENHYDRAMINE HCL 50 MG/ML IJ SOLN: 12.5000 mg | INTRAMUSCULAR | Status: DC | PRN

## 2014-08-25 MED ORDER — OXYTOCIN BOLUS FROM INFUSION: 500.0000 mL | INTRAVENOUS | Status: DC

## 2014-08-25 MED ORDER — LACTATED RINGERS IV SOLN: 500.0000 mL | Freq: Once | INTRAVENOUS | Status: DC

## 2014-08-25 MED ORDER — OXYTOCIN 40 UNITS IN LACTATED RINGERS INFUSION - SIMPLE MED
62.5000 mL/h | INTRAVENOUS | Status: DC
Start: 2014-08-25 — End: 2014-08-26
  Filled 2014-08-25: qty 1000

## 2014-08-25 MED ORDER — LIDOCAINE HCL (PF) 1 % IJ SOLN
30.0000 mL | INTRAMUSCULAR | Status: DC | PRN
  Filled 2014-08-25: qty 30

## 2014-08-25 MED ORDER — CITRIC ACID-SODIUM CITRATE 334-500 MG/5ML PO SOLN: 30.0000 mL | ORAL | Status: DC | PRN

## 2014-08-25 MED ORDER — FENTANYL 2.5 MCG/ML BUPIVACAINE 1/10 % EPIDURAL INFUSION (WH - ANES): 14.0000 mL/h | INTRAMUSCULAR | Status: DC | PRN

## 2014-08-25 MED ORDER — ONDANSETRON HCL 4 MG/2ML IJ SOLN: 4.0000 mg | Freq: Four times a day (QID) | INTRAMUSCULAR | Status: DC | PRN

## 2014-08-25 MED ORDER — FENTANYL CITRATE 0.05 MG/ML IJ SOLN
100.0000 ug | INTRAMUSCULAR | Status: DC | PRN
  Administered 2014-08-25 (×3): 100 ug via INTRAVENOUS
  Filled 2014-08-25 (×3): qty 2

## 2014-08-25 NOTE — MAU Note (Signed)
Per HMitchell, RN charge, pt to go to room 174. EFM tracing reviewed

## 2014-08-25 NOTE — Plan of Care (Signed)
Problem: Consults Goal: Birthing Suites Patient Information Press F2 to bring up selections list   Pt 37-[redacted] weeks EGA     

## 2014-08-25 NOTE — Discharge Instructions (Signed)
You may take over the counter Benadryl for sleep, follow the directions on box.  Keep your doctor appointments. Vaginal Delivery During delivery, your health care provider will help you give birth to your baby. During a vaginal delivery, you will work to push the baby out of your vagina. However, before you can push your baby out, a few things need to happen. The opening of your uterus (cervix) has to soften, thin out, and open up (dilate) all the way to 10 cm. Also, your baby has to move down from the uterus into your vagina.  SIGNS OF LABOR  Your health care provider will first need to make sure you are in labor. Signs of labor include:   Passing what is called the mucous plug before labor begins. This is a small amount of blood-stained mucus.  Having regular, painful uterine contractions.   The time between contractions gets shorter.   The discomfort and pain gradually get more intense.  Contraction pains get worse when walking and do not go away when resting.   Your cervix becomes thinner (effacement) and dilates. BEFORE THE DELIVERY Once you are in labor and admitted into the hospital or care center, your health care provider may do the following:   Perform a complete physical exam.  Review any complications related to pregnancy or labor.  Check your blood pressure, pulse, temperature, and heart rate (vital signs).   Determine if, and when, the rupture of amniotic membranes occurred.  Do a vaginal exam (using a sterile glove and lubricant) to determine:   The position (presentation) of the baby. Is the baby's head presenting first (vertex) in the birth canal (vagina), or are the feet or buttocks first (breech)?   The level (station) of the baby's head within the birth canal.   The effacement and dilatation of the cervix.   An electronic fetal monitor is usually placed on your abdomen when you first arrive. This is used to monitor your contractions and the baby's  heart rate.  When the monitor is on your abdomen (external fetal monitor), it can only pick up the frequency and length of your contractions. It cannot tell the strength of your contractions.  If it becomes necessary for your health care provider to know exactly how strong your contractions are or to see exactly what the baby's heart rate is doing, an internal monitor may be inserted into your vagina and uterus. Your health care provider will discuss the benefits and risks of using an internal monitor and obtain your permission before inserting the device.  Continuous fetal monitoring may be needed if you have an epidural, are receiving certain medicines (such as oxytocin), or have pregnancy or labor complications.  An IV access tube may be placed into a vein in your arm to deliver fluids and medicines if necessary. THREE STAGES OF LABOR AND DELIVERY Normal labor and delivery is divided into three stages. First Stage This stage starts when you begin to contract regularly and your cervix begins to efface and dilate. It ends when your cervix is completely open (fully dilated). The first stage is the longest stage of labor and can last from 3 hours to 15 hours.  Several methods are available to help with labor pain. You and your health care provider will decide which option is best for you. Options include:   Opioid medicines. These are strong pain medicines that you can get through your IV tube or as a shot into your muscle. These medicines lessen pain  but do not make it go away completely.  Epidural. A medicine is given through a thin tube that is inserted in your back. The medicine numbs the lower part of your body and prevents any pain in that area.  Paracervical pain medicine. This is an injection of an anesthetic on each side of your cervix.   You may request natural childbirth, which does not involve the use of pain medicines or an epidural during labor and delivery. Instead, you will use  other things, such as breathing exercises, to help cope with the pain. Second Stage The second stage of labor begins when your cervix is fully dilated at 10 cm. It continues until you push your baby down through the birth canal and the baby is born. This stage can take only minutes or several hours.  The location of your baby's head as it moves through the birth canal is reported as a number called a station. If the baby's head has not started its descent, the station is described as being at minus 3 (-3). When your baby's head is at the zero station, it is at the middle of the birth canal and is engaged in the pelvis. The station of your baby helps indicate the progress of the second stage of labor.  When your baby is born, your health care provider may hold the baby with his or her head lowered to prevent amniotic fluid, mucus, and blood from getting into the baby's lungs. The baby's mouth and nose may be suctioned with a small bulb syringe to remove any additional fluid.  Your health care provider may then place the baby on your stomach. It is important to keep the baby from getting cold. To do this, the health care provider will dry the baby off, place the baby directly on your skin (with no blankets between you and the baby), and cover the baby with warm, dry blankets.   The umbilical cord is cut. Third Stage During the third stage of labor, your health care provider will deliver the placenta (afterbirth) and make sure your bleeding is under control. The delivery of the placenta usually takes about 5 minutes but can take up to 30 minutes. After the placenta is delivered, a medicine may be given either by IV or injection to help contract the uterus and control bleeding. If you are planning to breastfeed, you can try to do so now. After you deliver the placenta, your uterus should contract and get very firm. If your uterus does not remain firm, your health care provider will massage it. This is  important because the contraction of the uterus helps cut off bleeding at the site where the placenta was attached to your uterus. If your uterus does not contract properly and stay firm, you may continue to bleed heavily. If there is a lot of bleeding, medicines may be given to contract the uterus and stop the bleeding.  Document Released: 08/04/2008 Document Revised: 03/12/2014 Document Reviewed: 04/16/2013 [facility] Patient Information 2015 [place], [place]. This information is not intended to replace advice given to you by your health care provider. Make sure you discuss any questions you have with your health care provider. Braxton Hicks Contractions Contractions of the uterus can occur throughout pregnancy. Contractions are not always a sign that you are in labor.  WHAT ARE BRAXTON HICKS CONTRACTIONS?  Contractions that occur before labor are called Braxton Hicks contractions, or false labor. Toward the end of pregnancy (32-34 weeks), these contractions can develop more  often and may become more forceful. This is not true labor because these contractions do not result in opening (dilatation) and thinning of the cervix. They are sometimes difficult to tell apart from true labor because these contractions can be forceful and people have different pain tolerances. You should not feel embarrassed if you go to the hospital with false labor. Sometimes, the only way to tell if you are in true labor is for your health care provider to look for changes in the cervix. If there are no prenatal problems or other health problems associated with the pregnancy, it is completely safe to be sent home with false labor and await the onset of true labor. HOW CAN YOU TELL THE DIFFERENCE BETWEEN TRUE AND FALSE LABOR? False Labor  The contractions of false labor are usually shorter and not as hard as those of true labor.   The contractions are usually irregular.   The contractions are often felt in the front of the  lower abdomen and in the groin.   The contractions may go away when you walk around or change positions while lying down.   The contractions get weaker and are shorter lasting as time goes on.   The contractions do not usually become progressively stronger, regular, and closer together as with true labor.  True Labor  Contractions in true labor last 30-70 seconds, become very regular, usually become more intense, and increase in frequency.   The contractions do not go away with walking.   The discomfort is usually felt in the top of the uterus and spreads to the lower abdomen and low back.   True labor can be determined by your health care provider with an exam. This will show that the cervix is dilating and getting thinner.  WHAT TO REMEMBER  Keep up with your usual exercises and follow other instructions given by your health care provider.   Take medicines as directed by your health care provider.   Keep your regular prenatal appointments.   Eat and drink lightly if you think you are going into labor.   If Braxton Hicks contractions are making you uncomfortable:   Change your position from lying down or resting to walking, or from walking to resting.   Sit and rest in a tub of warm water.   Drink 2-3 glasses of water. Dehydration may cause these contractions.   Do slow and deep breathing several times an hour.  WHEN SHOULD I SEEK IMMEDIATE MEDICAL CARE? Seek immediate medical care if:  Your contractions become stronger, more regular, and closer together.   You have fluid leaking or gushing from your vagina.   You have a fever.   You pass blood-tinged mucus.   You have vaginal bleeding.   You have continuous abdominal pain.   You have low back pain that you never had before.   You feel your baby's head pushing down and causing pelvic pressure.   Your baby is not moving as much as it used to.  Document Released: 10/26/2005 Document  Revised: 10/31/2013 Document Reviewed: 08/07/2013 [facility] Patient Information 2015 [place], [place]. This information is not intended to replace advice given to you by your health care provider. Make sure you discuss any questions you have with your health care provider. Fetal Movement Counts Patient Name: __________________________________________________ Patient Due Date: ____________________ Performing a fetal movement count is highly recommended in high-risk pregnancies, but it is good for every pregnant woman to do. Your health care provider may ask you to  start counting fetal movements at 28 weeks of the pregnancy. Fetal movements often increase:  After eating a full meal.  After physical activity.  After eating or drinking something sweet or cold.  At rest. Pay attention to when you feel the baby is most active. This will help you notice a pattern of your baby's sleep and wake cycles and what factors contribute to an increase in fetal movement. It is important to perform a fetal movement count at the same time each day when your baby is normally most active.  HOW TO COUNT FETAL MOVEMENTS 1. Find a quiet and comfortable area to sit or lie down on your left side. Lying on your left side provides the best blood and oxygen circulation to your baby. 2. Write down the day and time on a sheet of paper or in a journal. 3. Start counting kicks, flutters, swishes, rolls, or jabs in a 2-hour period. You should feel at least 10 movements within 2 hours. 4. If you do not feel 10 movements in 2 hours, wait 2-3 hours and count again. Look for a change in the pattern or not enough counts in 2 hours. SEEK MEDICAL CARE IF:  You feel less than 10 counts in 2 hours, tried twice.  There is no movement in over an hour.  The pattern is changing or taking longer each day to reach 10 counts in 2 hours.  You feel the baby is not moving as he or she usually does. Date: ____________ Movements: ____________  Start time: ____________ Doreatha MartinFinish time: ____________  Date: ____________ Movements: ____________ Start time: ____________ Doreatha MartinFinish time: ____________ Date: ____________ Movements: ____________ Start time: ____________ Doreatha MartinFinish time: ____________ Date: ____________ Movements: ____________ Start time: ____________ Doreatha MartinFinish time: ____________ Date: ____________ Movements: ____________ Start time: ____________ Doreatha MartinFinish time: ____________ Date: ____________ Movements: ____________ Start time: ____________ Doreatha MartinFinish time: ____________ Date: ____________ Movements: ____________ Start time: ____________ Doreatha MartinFinish time: ____________ Date: ____________ Movements: ____________ Start time: ____________ Doreatha MartinFinish time: ____________  Date: ____________ Movements: ____________ Start time: ____________ Doreatha MartinFinish time: ____________ Date: ____________ Movements: ____________ Start time: ____________ Doreatha MartinFinish time: ____________ Date: ____________ Movements: ____________ Start time: ____________ Doreatha MartinFinish time: ____________ Date: ____________ Movements: ____________ Start time: ____________ Doreatha MartinFinish time: ____________ Date: ____________ Movements: ____________ Start time: ____________ Doreatha MartinFinish time: ____________ Date: ____________ Movements: ____________ Start time: ____________ Doreatha MartinFinish time: ____________ Date: ____________ Movements: ____________ Start time: ____________ Doreatha MartinFinish time: ____________  Date: ____________ Movements: ____________ Start time: ____________ Doreatha MartinFinish time: ____________ Date: ____________ Movements: ____________ Start time: ____________ Doreatha MartinFinish time: ____________ Date: ____________ Movements: ____________ Start time: ____________ Doreatha MartinFinish time: ____________ Date: ____________ Movements: ____________ Start time: ____________ Doreatha MartinFinish time: ____________ Date: ____________ Movements: ____________ Start time: ____________ Doreatha MartinFinish time: ____________ Date: ____________ Movements: ____________ Start time: ____________ Doreatha MartinFinish time:  ____________ Date: ____________ Movements: ____________ Start time: ____________ Doreatha MartinFinish time: ____________  Date: ____________ Movements: ____________ Start time: ____________ Doreatha MartinFinish time: ____________ Date: ____________ Movements: ____________ Start time: ____________ Doreatha MartinFinish time: ____________ Date: ____________ Movements: ____________ Start time: ____________ Doreatha MartinFinish time: ____________ Date: ____________ Movements: ____________ Start time: ____________ Doreatha MartinFinish time: ____________ Date: ____________ Movements: ____________ Start time: ____________ Doreatha MartinFinish time: ____________ Date: ____________ Movements: ____________ Start time: ____________ Doreatha MartinFinish time: ____________ Date: ____________ Movements: ____________ Start time: ____________ Doreatha MartinFinish time: ____________  Date: ____________ Movements: ____________ Start time: ____________ Doreatha MartinFinish time: ____________ Date: ____________ Movements: ____________ Start time: ____________ Doreatha MartinFinish time: ____________ Date: ____________ Movements: ____________ Start time: ____________ Doreatha MartinFinish time: ____________ Date: ____________ Movements: ____________ Start time: ____________ Doreatha MartinFinish time: ____________ Date: ____________ Movements: ____________ Start time: ____________  Finish time: ____________ Date: ____________ Movements: ____________ Start time: ____________ Doreatha Martin time: ____________ Date: ____________ Movements: ____________ Start time: ____________ Doreatha Martin time: ____________  Date: ____________ Movements: ____________ Start time: ____________ Doreatha Martin time: ____________ Date: ____________ Movements: ____________ Start time: ____________ Doreatha Martin time: ____________ Date: ____________ Movements: ____________ Start time: ____________ Doreatha Martin time: ____________ Date: ____________ Movements: ____________ Start time: ____________ Doreatha Martin time: ____________ Date: ____________ Movements: ____________ Start time: ____________ Doreatha Martin time: ____________ Date: ____________ Movements:  ____________ Start time: ____________ Doreatha Martin time: ____________ Date: ____________ Movements: ____________ Start time: ____________ Doreatha Martin time: ____________  Date: ____________ Movements: ____________ Start time: ____________ Doreatha Martin time: ____________ Date: ____________ Movements: ____________ Start time: ____________ Doreatha Martin time: ____________ Date: ____________ Movements: ____________ Start time: ____________ Doreatha Martin time: ____________ Date: ____________ Movements: ____________ Start time: ____________ Doreatha Martin time: ____________ Date: ____________ Movements: ____________ Start time: ____________ Doreatha Martin time: ____________ Date: ____________ Movements: ____________ Start time: ____________ Doreatha Martin time: ____________ Date: ____________ Movements: ____________ Start time: ____________ Doreatha Martin time: ____________  Date: ____________ Movements: ____________ Start time: ____________ Doreatha Martin time: ____________ Date: ____________ Movements: ____________ Start time: ____________ Doreatha Martin time: ____________ Date: ____________ Movements: ____________ Start time: ____________ Doreatha Martin time: ____________ Date: ____________ Movements: ____________ Start time: ____________ Doreatha Martin time: ____________ Date: ____________ Movements: ____________ Start time: ____________ Doreatha Martin time: ____________ Date: ____________ Movements: ____________ Start time: ____________ Doreatha Martin time: ____________ Document Released: 11/25/2006 Document Revised: 03/12/2014 Document Reviewed: 08/22/2012 [employer] Patient Information 2015 [employer]. This information is not intended to replace advice given to you by your health care provider. Make sure you discuss any questions you have with your health care provider.

## 2014-08-25 NOTE — MAU Note (Signed)
Pt presents to MAU with complaints of contractions that started last night and was evaluated in MAU and was sent home. Contractions have gotten worse today

## 2014-08-25 NOTE — MAU Note (Signed)
Contractions since Friday. Closer and stronger now. Denies bleeding. Some brown d/c

## 2014-08-25 NOTE — Progress Notes (Signed)
Shelley Simmons is a 18 y.o. G2P0010 at 4840w6d admitted for active labor  Subjective: Pt breathing through contractions, reports lots of back pain, mother at bedside rubbing her back.    Objective: BP 108/70  Pulse 96  Temp(Src) 98.4 F (36.9 C) (Oral)  Resp 18  Ht 5\' 2"  (1.575 m)  Wt 83.915 kg (185 lb)  BMI 33.83 kg/m2  SpO2 99%  LMP 11/19/2013      FHT:  FHR: 130 bpm, variability: moderate,  accelerations:  Present,  decelerations:  Absent UC:   regular, every 3-4 minutes SVE:   Dilation: 7.5 Effacement (%): 100 Station: +1 Exam by:: Peabody EnergyVeronica mensah  Labs: Lab Results  Component Value Date   WBC 14.5* 08/25/2014   HGB 11.2* 08/25/2014   HCT 31.8* 08/25/2014   MCV 85.9 08/25/2014   PLT 370 08/25/2014    Assessment / Plan: Spontaneous labor, progressing normally  Labor: Progressing normally.  Changed Fentanyl order to 100 mcg Q 1 hour.  Preeclampsia:  n/a Fetal Wellbeing:  Category I Pain Control:  Fentanyl I/D:  n/a Anticipated MOD:  NSVD  Simmons, Shelley Paradiso 08/25/2014, 11:44 PM

## 2014-08-25 NOTE — Progress Notes (Signed)
Pt vomited small amt. Gown changed and then up to BR.

## 2014-08-25 NOTE — MAU Provider Note (Signed)
Shelley Simmons 18 y.o. G2P0010 5645w6d presenting to MAU for second time since last nigt with continuing painful UCs. Cx was 3cm. Now per RN 3-4 bulging with UCs q2-253min. Will admit for expectant management of labor. See Admission H&P. Shelley Simmons, CNM 08/25/2014 2:02 PM

## 2014-08-25 NOTE — H&P (Signed)
Attestation of Attending Supervision of Advanced Practitioner (PA/CNM/NP): Evaluation and management procedures were performed by the Advanced Practitioner under my supervision and collaboration.  I have reviewed the Advanced Practitioner's note and chart, and I agree with the management and plan.  Tj Kitchings, MD, FACOG Attending Obstetrician & Gynecologist Faculty Practice, [facility]   

## 2014-08-25 NOTE — Progress Notes (Signed)
Shelley Simmons is a 18 y.o. G2P0010 at 7213w6d admitted for active labor  Subjective: Pt breathing with contractions, family in room for support.  Objective: BP 115/63  Pulse 104  Temp(Src) 98.4 F (36.9 C) (Oral)  Resp 20  Ht 5\' 2"  (1.575 m)  Wt 83.915 kg (185 lb)  BMI 33.83 kg/m2  SpO2 99%  LMP 11/19/2013      FHT:  FHR: 135 bpm, variability: moderate,  accelerations:  Present,  decelerations:  Absent UC:   regular, every 3 minutes SVE:   Dilation: 6 Effacement (%): 100 Station: -1 Exam by::Lisa [name], CNM  Discussed options with pt including expectant management, AROM, or starting Pitocin.  Pt prefers AROM.  AROM with clear fluid, pt tolerated well.  Labs: Lab Results  Component Value Date   WBC 14.5* 08/25/2014   HGB 11.2* 08/25/2014   HCT 31.8* 08/25/2014   MCV 85.9 08/25/2014   PLT 370 08/25/2014    Assessment / Plan: Augmentation of labor, progressing well  Labor: Progressing normally Preeclampsia:  n/a Fetal Wellbeing:  Category I Pain Control:  Fentanyl I/D:  n/a Anticipated MOD:  NSVD  [name], LISA 08/25/2014, 10:27 PM

## 2014-08-25 NOTE — H&P (Signed)
Shelley AngelBrenda Yoliveth Simmons is a 18 y.o. female G2P0010 at 4340w6d by LMP confirmed by scan at 13 wks presenting for SOL. Seen here several hours ago for labor check and cx was 3 cm. Has not slept and UCs stronger.  [facility] uncom[facility]  Maternal Medical History:  Reason for admission: Nausea.    OB History   Grav Para Term Preterm Abortions TAB SAB Ect Mult Living   2    1 1          Past Medical History  Diagnosis Date  . Medical history non-contributory    Past Surgical History  Procedure Laterality Date  . No past surgeries     Family History: family history is not on file. Social History:  reports that she has never smoked. She has never used smokeless tobacco. She reports that she does not drink alcohol or use illicit drugs.   Prenatal Transfer Tool  Maternal Diabetes: No Genetic Screening: Normal Maternal Ultrasounds/Referrals: Normal Fetal Ultrasounds or other Referrals:  None Maternal Substance Abuse:  No Significant Maternal Medications:  None Significant Maternal Lab Results:  Lab values include: Group B Strep negative Other Comments:  None  Review of Systems  Constitutional: Negative for fever and chills.  Respiratory: Negative for cough.   Cardiovascular: Negative for chest pain.  Gastrointestinal: Positive for abdominal pain. Negative for nausea and vomiting.  Genitourinary: Negative for dysuria.  Neurological: Negative for dizziness and headaches.  Psychiatric/Behavioral: Negative for depression and memory loss. The patient is not nervous/anxious and does not have insomnia.     Dilation: 5 Effacement (%): 90 Station: 0 Exam by:: d Trayshawn Durkin,cnm Blood pressure 111/78, pulse 113, temperature 98 F (36.7 C), temperature source Oral, resp. rate 20, height 5\' 2"  (1.575 m), weight 83.915 kg (185 lb), last menstrual period 11/19/2013. Maternal Exam:  Uterine Assessment: Contraction strength is firm.  Contraction duration is 50 seconds. Contraction  frequency is regular.   Abdomen: Estimated fetal weight is 7#.   Fetal presentation: vertex  Introitus: Normal vulva. Normal vagina.  Ferning test: not done.  Nitrazine test: not done. Amniotic fluid character: not assessed.  Pelvis: adequate for delivery.   Cervix: Cervix evaluated by sterile speculum exam.     Fetal Exam Fetal Monitor Review: Mode: ultrasound.   Baseline rate: 130.  Variability: moderate (6-25 bpm).   Pattern: accelerations present and no decelerations.    Fetal State Assessment: Category I - tracings are normal.     Physical Exam  Nursing note and vitals reviewed. Constitutional: She is oriented to person, place, and time. She appears well-developed and well-nourished.  HENT:  Head: Normocephalic.  Eyes: Pupils are equal, round, and reactive to light.  Neck: Normal range of motion. No thyromegaly present.  Cardiovascular: Normal rate, regular rhythm and normal heart sounds.   Respiratory: Effort normal and breath sounds normal.  GI: Soft. There is no tenderness.  Genitourinary: Vagina normal.  bulging bag, + show   Musculoskeletal: Normal range of motion.  Neurological: She is alert and oriented to person, place, and time. She has normal reflexes.  Skin: Skin is warm and dry.  Psychiatric: She has a normal mood and affect. Her behavior is normal.    Prenatal labs: ABO, Rh: O/Positive/-- (04/06 0000) Antibody: Negative (04/06 0000) Rubella: Immune (04/06 0000) RPR: Nonreactive (04/06 0000)  HBsAg: Negative (04/06 0000)  HIV: Non-reactive (04/06 0000)  GBS: Negative (09/21 0000)  GCT: 99  Assessment/Plan: G2P0010 in active labor> offerred analgesia (dclines), expectant management  Shelley Simmons 08/25/2014, 3:33 PM

## 2014-08-26 ENCOUNTER — Encounter (HOSPITAL_COMMUNITY): Payer: Self-pay | Admitting: General Practice

## 2014-08-26 DIAGNOSIS — Z3A40 40 weeks gestation of pregnancy: Secondary | ICD-10-CM

## 2014-08-26 MED ORDER — OXYCODONE-ACETAMINOPHEN 5-325 MG PO TABS: 1.0000 | ORAL_TABLET | ORAL | Status: DC | PRN

## 2014-08-26 MED ORDER — ZOLPIDEM TARTRATE 5 MG PO TABS: 5.0000 mg | ORAL_TABLET | Freq: Every evening | ORAL | Status: DC | PRN

## 2014-08-26 MED ORDER — SENNOSIDES-DOCUSATE SODIUM 8.6-50 MG PO TABS
2.0000 | ORAL_TABLET | ORAL | Status: DC
  Administered 2014-08-27: 2 via ORAL
  Filled 2014-08-26: qty 2

## 2014-08-26 MED ORDER — IBUPROFEN 600 MG PO TABS
600.0000 mg | ORAL_TABLET | Freq: Four times a day (QID) | ORAL | Status: DC
  Administered 2014-08-26 – 2014-08-27 (×6): 600 mg via ORAL
  Filled 2014-08-26 (×6): qty 1

## 2014-08-26 MED ORDER — ONDANSETRON HCL 4 MG/2ML IJ SOLN: 4.0000 mg | INTRAMUSCULAR | Status: DC | PRN

## 2014-08-26 MED ORDER — OXYTOCIN 10 UNIT/ML IJ SOLN
10.0000 [IU] | Freq: Once | INTRAMUSCULAR | Status: AC
  Administered 2014-08-26: 10 [IU] via INTRAMUSCULAR

## 2014-08-26 MED ORDER — SIMETHICONE 80 MG PO CHEW: 80.0000 mg | CHEWABLE_TABLET | ORAL | Status: DC | PRN

## 2014-08-26 MED ORDER — LANOLIN HYDROUS EX OINT
TOPICAL_OINTMENT | CUTANEOUS | Status: DC | PRN
Start: 2014-08-26 — End: 2014-08-27

## 2014-08-26 MED ORDER — OXYCODONE-ACETAMINOPHEN 5-325 MG PO TABS: 2.0000 | ORAL_TABLET | ORAL | Status: DC | PRN

## 2014-08-26 MED ORDER — OXYTOCIN 10 UNIT/ML IJ SOLN
INTRAMUSCULAR | Status: AC
  Filled 2014-08-26: qty 1

## 2014-08-26 MED ORDER — WITCH HAZEL-GLYCERIN EX PADS
1.0000 | MEDICATED_PAD | CUTANEOUS | Status: DC | PRN
Start: 2014-08-26 — End: 2014-08-27

## 2014-08-26 MED ORDER — DIBUCAINE 1 % RE OINT
1.0000 | TOPICAL_OINTMENT | RECTAL | Status: DC | PRN
Start: 2014-08-26 — End: 2014-08-27

## 2014-08-26 MED ORDER — PRENATAL MULTIVITAMIN CH
1.0000 | ORAL_TABLET | Freq: Every day | ORAL | Status: DC
  Administered 2014-08-26 – 2014-08-27 (×2): 1 via ORAL
  Filled 2014-08-26 (×2): qty 1

## 2014-08-26 MED ORDER — BENZOCAINE-MENTHOL 20-0.5 % EX AERO: 1.0000 "application " | INHALATION_SPRAY | CUTANEOUS | Status: DC | PRN

## 2014-08-26 MED ORDER — INFLUENZA VAC SPLIT QUAD 0.5 ML IM SUSY
0.5000 mL | PREFILLED_SYRINGE | INTRAMUSCULAR | Status: AC
  Administered 2014-08-26: 0.5 mL via INTRAMUSCULAR

## 2014-08-26 MED ORDER — TETANUS-DIPHTH-ACELL PERTUSSIS 5-2.5-18.5 LF-MCG/0.5 IM SUSP: 0.5000 mL | Freq: Once | INTRAMUSCULAR | Status: DC

## 2014-08-26 MED ORDER — ONDANSETRON HCL 4 MG PO TABS: 4.0000 mg | ORAL_TABLET | ORAL | Status: DC | PRN

## 2014-08-26 MED ORDER — DIPHENHYDRAMINE HCL 25 MG PO CAPS
25.0000 mg | ORAL_CAPSULE | Freq: Four times a day (QID) | ORAL | Status: DC | PRN
Start: 2014-08-26 — End: 2014-08-27

## 2014-08-26 NOTE — Plan of Care (Signed)
Problem: Discharge Progression Outcomes Goal: MMR given as ordered Outcome: Not Applicable Date Met:  73/73/66 Rubella Immune per labs.

## 2014-08-26 NOTE — Lactation Note (Signed)
This note was copied from the chart of Shelley Josephine CablesBrenda Jirak. Lactation Consultation Note  Patient Name: Shelley Simmons ZOXWR'UToday's Date: 08/26/2014 Reason for consult: Initial assessment Mom reports some nipple tenderness, positional stripe noted on right nipple. Nipples are flat with dimpling in center, not inverted. Become more erect with hand expression/stimulation. LC assisted Mom with positioning baby in football hold, demonstrated how to use breast compression to help w/latch. Demonstrated how to bring bottom lip down for more comfort. Baby demonstrated a good rhythmic suck, some swallows noted. Mom reports less discomfort. BF basics reviewed with Mom. Cluster feeding discussed. Hand pump given to Mom to pre-pump to help with latch. Cleaning reviewed. Lactation brochure left for review. Advised of OP services and support group. Encouraged to call for assist as needed.   Maternal Data Has patient been taught Hand Expression?: Yes Does the patient have breastfeeding experience prior to this delivery?: No  Feeding Feeding Type: Breast Fed Length of feed: 23 min  LATCH Score/Interventions Latch: Grasps breast easily, tongue down, lips flanged, rhythmical sucking. Intervention(s): Adjust position;Assist with latch;Breast massage;Breast compression  Audible Swallowing: A few with stimulation  Type of Nipple: Flat (dimpled in center, slight erect w/stimulation) Intervention(s): Hand pump  Comfort (Breast/Nipple): Filling, red/small blisters or bruises, mild/mod discomfort  Problem noted: Mild/Moderate discomfort (positional stripe right nipple) Interventions (Mild/moderate discomfort): Pre-pump if needed  Hold (Positioning): Assistance needed to correctly position infant at breast and maintain latch.  LATCH Score: 6  Lactation Tools Discussed/Used     Consult Status Consult Status: Follow-up Date: 08/27/14 Follow-up type: In-patient    [name], Shelley Sullivant Ann 08/26/2014, 11:52  PM

## 2014-08-27 NOTE — Discharge Summary (Signed)
Shelley CablesBrenda Simmons is a 18 yo G2P1011, PPD#1 for SVD at 0131.  Breastfeeding, going well. Plans on getting IUD at 6 weeks f/u.  Obstetric Discharge Summary Reason for Admission: onset of labor Prenatal Procedures: none Intrapartum Procedures: spontaneous vaginal delivery Postpartum Procedures: none Complications-Operative and Postpartum: none Hemoglobin  Date Value Ref Range Status  08/25/2014 11.2* 12.0 - 15.0 g/dL Final     HCT  Date Value Ref Range Status  08/25/2014 31.8* 36.0 - 46.0 % Final    Physical Exam:  General: alert, cooperative and no distress Lochia: appropriate Uterine Fundus: firm Incision: none DVT Evaluation: No evidence of DVT seen on physical exam.  Discharge Diagnoses: Term Pregnancy-delivered  Discharge Information: Date: 08/27/2014 Activity: pelvic rest Diet: routine Medications: Ibuprofen Condition: stable Instructions: refer to practice specific booklet Discharge to: home   Newborn Data: Live born female  Birth Weight: 6 lb 15.2 oz (3152 g) APGAR: 8, 9  Home with mother.  Jacquiline DoeParker, Caleb 08/27/2014, 8:06 AM  OB fellow attestation Post Partum Day 1 I have seen and examined this patient and agree with above documentation in the resident's note.   Shelley Simmons is a 18 y.o. G2P1011 s/p SVD.  Pt denies problems with ambulating, voiding or po intake. Pain is well controlled.  Plan for birth control is IUD.  Method of Feeding: breast  PE:  BP 102/63  Pulse 76  Temp(Src) 97.8 F (36.6 C) (Oral)  Resp 19  Ht 5\' 2"  (1.575 m)  Wt 185 lb (83.915 kg)  BMI 33.83 kg/m2  SpO2 100%  LMP 11/19/2013  Breastfeeding? Unknown Fundus firm  Plan for discharge: today  Perry MountACOSTA,Zayyan Mullen ROCIO, MD 12:20 PM

## 2014-08-27 NOTE — Plan of Care (Signed)
Problem: Discharge Progression Outcomes Goal: Afebrile, VS remain stable at discharge pt  Comments:  Patient has shells and nipples shield.  States nipples feeling much better however bruising noted on Left nipple

## 2014-08-27 NOTE — Progress Notes (Signed)
Ur chart review completed.  

## 2014-08-27 NOTE — Lactation Note (Signed)
This note was copied from the chart of Shelley Josephine CablesBrenda Herandez. Lactation Consultation Note RN reported that baby has been fussy all night and wouldn't latch long on the breast, off and on. Noted positional stripe to Lt. Nipple and bruising to center of Rt. Nipple. Slight dimple in center of nipple. Shells given to wear in bra between feedings except sleep. Comfort gels given. Baby has small high short palate (like a small cup). Nipples slightly flat, everts slightly w/stimulation, encouraged to roll in finger tips. Baby wouldn't latch. #20 NS applied to Lt. Nipple and baby latched well in football hold. #24 NS given as well if #20 becomes uncomfortable. Explained how sizes changes. Information sheet on NS given. No interpreter needed, communicates well in [place], denies needing interpreter. Application of NS taught and demonstrated. States feels much better. BF great. Encouraged to monitor output w/intake. Patient Name: Shelley Simmons ZOXWR'UToday's Date: 08/27/2014 Reason for consult: Follow-up assessment;Breast/nipple pain;Difficult latch   Maternal Data    Feeding Feeding Type: Breast Fed Length of feed: 20 min  LATCH Score/Interventions Latch: Grasps breast easily, tongue down, lips flanged, rhythmical sucking. Intervention(s): Adjust position;Assist with latch;Breast massage;Breast compression  Audible Swallowing: A few with stimulation Intervention(s): Skin to skin;Hand expression;Alternate breast massage  Type of Nipple: Flat (slightly flat) Intervention(s): Shells;Hand pump  Comfort (Breast/Nipple): Filling, red/small blisters or bruises, mild/mod discomfort  Problem noted: Mild/Moderate discomfort Interventions (Mild/moderate discomfort): Hand massage;Hand expression;Pre-pump if needed;Comfort gels;Breast shields  Hold (Positioning): Assistance needed to correctly position infant at breast and maintain latch. Intervention(s): Breastfeeding basics reviewed;Support Pillows;Position  options;Skin to skin  LATCH Score: 6  Lactation Tools Discussed/Used Tools: Shells;Nipple Shields;Pump;Comfort gels Nipple shield size: 20;24 Shell Type: Inverted Breast pump type: Manual Pump Review: Setup, frequency, and cleaning;Milk Storage Initiated by:: [name] RN Date initiated:: 08/27/14   Consult Status Consult Status: Follow-up Date: 08/27/14 Follow-up type: In-patient    [name], [name] 08/27/2014, 5:29 AM

## 2014-08-27 NOTE — Discharge Summary (Signed)
Attestation of Attending Supervision of Obstetric Fellow: Evaluation and management procedures were performed by the Obstetric Fellow under my supervision and collaboration.  I have reviewed the Obstetric Fellow's note and chart, and I agree with the management and plan.  Villa Burgin, DO Attending Physician Faculty Practice, [facility]  

## 2014-08-29 NOTE — MAU Provider Note (Signed)
`````

## 2014-09-10 ENCOUNTER — Encounter (HOSPITAL_COMMUNITY): Payer: Self-pay | Admitting: General Practice

## 2016-11-09 NOTE — L&D Delivery Note (Signed)
Patient is 21 y.o. G3P1011 [redacted]w[redacted]d admitted SOL. S/p augmentation with Pitocin. AROM at 1107.  Prenatal course was uncomplicated.  Delivery Note At 5:31 PM a viable female was delivered via Vaginal, Spontaneous Delivery, presentation LOA.  APGARs and weight pending.   Placenta status: intact, spontaneous  Cord: 3 vessel   Anesthesia:  epidural Episiotomy:  none Lacerations: Labial, right, hemostatic Suture Repair: none Est. Blood Loss (mL): 350  Mom to postpartum.  Baby to Couplet care / Skin to Skin.   Upon arrival, patient was complete. She pushed with good maternal effort to deliver a viable female infant in cephalic, LOA position over intact perineum. Single nuchal cord,easily reduced. Posterior arm delivered first, then followed by anterior shoulder. Baby was noted to have good tone and place on maternal abdomen for oral suctioning, drying and stimulation. Delayed cord clamping performed. Placenta delivered spontaneously with gentle cord traction. Fundus firm with massage and Pitocin. Perineum inspected and found to have right labial laceration, which was found to be hemostatic, which was not  Repaired. Counts of sharps, instruments, and lap pads were all correct. Precepted by Dr. Vergie Living.   Rolm Bookbinder, DO [place] Fellow

## 2017-01-14 ENCOUNTER — Encounter (HOSPITAL_COMMUNITY): Payer: Self-pay | Admitting: *Deleted

## 2017-01-14 ENCOUNTER — Inpatient Hospital Stay (HOSPITAL_COMMUNITY)
Admission: AD | Admit: 2017-01-14 | Discharge: 2017-01-14 | Discharge status: Against Medical Advice | Payer: Medicaid Other | Source: Ambulatory Visit | Attending: Obstetrics and Gynecology | Admitting: Obstetrics and Gynecology

## 2017-01-14 ENCOUNTER — Inpatient Hospital Stay (HOSPITAL_COMMUNITY): Payer: Medicaid Other

## 2017-01-14 DIAGNOSIS — O209 Hemorrhage in early pregnancy, unspecified: Secondary | ICD-10-CM | POA: Diagnosis not present

## 2017-01-14 DIAGNOSIS — Z3A00 Weeks of gestation of pregnancy not specified: Secondary | ICD-10-CM | POA: Diagnosis not present

## 2017-01-14 LAB — POCT PREGNANCY, URINE: PREG TEST UR: POSITIVE — AB

## 2017-01-14 LAB — URINALYSIS, ROUTINE W REFLEX MICROSCOPIC
Bilirubin Urine: NEGATIVE
Glucose, UA: NEGATIVE mg/dL
Hgb urine dipstick: NEGATIVE
Ketones, ur: 5 mg/dL — AB
Leukocytes, UA: NEGATIVE
Nitrite: NEGATIVE
Protein, ur: NEGATIVE mg/dL
SPECIFIC GRAVITY, URINE: 1.014 (ref 1.005–1.030)
pH: 7 (ref 5.0–8.0)

## 2017-01-14 LAB — CBC
HEMATOCRIT: 37.5 % (ref 36.0–46.0)
HEMOGLOBIN: 13.1 g/dL (ref 12.0–15.0)
MCH: 30.1 pg (ref 26.0–34.0)
MCHC: 34.9 g/dL (ref 30.0–36.0)
MCV: 86.2 fL (ref 78.0–100.0)
Platelets: 401 10*3/uL — ABNORMAL HIGH (ref 150–400)
RBC: 4.35 MIL/uL (ref 3.87–5.11)
RDW: 13.2 % (ref 11.5–15.5)
WBC: 12.9 10*3/uL — ABNORMAL HIGH (ref 4.0–10.5)

## 2017-01-14 LAB — HCG, QUANTITATIVE, PREGNANCY: hCG, Beta Chain, Quant, S: 67705 m[IU]/mL — ABNORMAL HIGH (ref <5)

## 2017-01-14 NOTE — MAU Note (Signed)
Pt started spotting & also passed mucus 2 days ago, denies bleeding or pain today.  [facility] 5 weeks.  Has first OB appointment later this month @ [facility].

## 2017-01-14 NOTE — MAU Note (Signed)
Pt. Called x3 and was not in the lobby

## 2017-01-14 NOTE — MAU Note (Signed)
Shelley Simmons in registration states pt told her she has to leave due to childcare but is planning to come back.

## 2017-01-15 ENCOUNTER — Inpatient Hospital Stay (HOSPITAL_COMMUNITY): Payer: Medicaid Other

## 2017-01-15 ENCOUNTER — Inpatient Hospital Stay (HOSPITAL_COMMUNITY)
Admission: AD | Admit: 2017-01-15 | Discharge: 2017-01-15 | Disposition: A | Discharge status: Home / Self Care | Payer: Medicaid Other | Source: Ambulatory Visit | Attending: Obstetrics & Gynecology | Admitting: Obstetrics & Gynecology

## 2017-01-15 DIAGNOSIS — O26899 Other specified pregnancy related conditions, unspecified trimester: Secondary | ICD-10-CM

## 2017-01-15 DIAGNOSIS — O208 Other hemorrhage in early pregnancy: Secondary | ICD-10-CM | POA: Diagnosis not present

## 2017-01-15 DIAGNOSIS — O26891 Other specified pregnancy related conditions, first trimester: Secondary | ICD-10-CM | POA: Insufficient documentation

## 2017-01-15 DIAGNOSIS — Z3491 Encounter for supervision of normal pregnancy, unspecified, first trimester: Secondary | ICD-10-CM

## 2017-01-15 DIAGNOSIS — Z91013 Allergy to seafood: Secondary | ICD-10-CM | POA: Insufficient documentation

## 2017-01-15 DIAGNOSIS — R109 Unspecified abdominal pain: Secondary | ICD-10-CM | POA: Diagnosis not present

## 2017-01-15 DIAGNOSIS — Z3A12 12 weeks gestation of pregnancy: Secondary | ICD-10-CM | POA: Diagnosis not present

## 2017-01-15 DIAGNOSIS — N939 Abnormal uterine and vaginal bleeding, unspecified: Secondary | ICD-10-CM | POA: Diagnosis present

## 2017-01-15 LAB — HIV ANTIBODY (ROUTINE TESTING W REFLEX): HIV Screen 4th Generation wRfx: NONREACTIVE

## 2017-01-15 NOTE — MAU Provider Note (Signed)
  History     CSN: [id number]  Arrival date and time: 01/15/17 1415   None     Chief Complaint  Patient presents with  . Vaginal Bleeding   Patient is a 21 year old G3 P1 at 12 weeks and 4 days by last menstrual period consistent with ultrasound today. Patient had bleeding 3-4 days ago and had a beta hCG drawn but was unable to stay for ultrasound. She returned today for ultrasound. She has had no bleeding since her previous visit. She reports some mild intermittent abdominal pain but no other complaints. [facility] of this month since her last visit.    OB History    Gravida Para Term Preterm AB Living   3 1 1   1 1    SAB TAB Ectopic Multiple Live Births     1     1      Past Medical History:  Diagnosis Date  . Medical history non-contributory     Past Surgical History:  Procedure Laterality Date  . NO PAST SURGERIES      No family history on file.  Social History  Substance Use Topics  . Smoking status: Never Smoker  . Smokeless tobacco: Never Used  . Alcohol use No    Allergies:  Allergies  Allergen Reactions  . 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 Prenatal Vit-Fe Fumarate-FA (PRENATAL MULTIVITAMIN) TABS tablet Take 1 tablet by mouth daily at 12 noon.   Past Week at Unknown time    Review of Systems  Constitutional: Negative for chills and fever.  HENT: Negative for congestion and rhinorrhea.   Respiratory: Negative for cough and shortness of breath.   Cardiovascular: Negative for chest pain and palpitations.  Gastrointestinal: Negative for abdominal distention, abdominal pain, constipation, nausea and vomiting.  Genitourinary: Negative for difficulty urinating, dysuria, flank pain and frequency.  Musculoskeletal: Negative for back pain.  Neurological: Negative for dizziness and weakness.   Physical Exam   Blood  pressure 115/61, pulse 75, temperature 98.2 F (36.8 C), temperature source Oral, resp. rate 18, height 5\' 1"  (1.549 m), weight 191 lb (86.6 kg), last menstrual period 10/19/2016, unknown if currently breastfeeding.  Physical Exam  Vitals reviewed. Constitutional: She is oriented to person, place, and time. She appears well-developed and well-nourished.  HENT:  Head: Normocephalic and atraumatic.  Cardiovascular: Normal rate and intact distal pulses.   Respiratory: Effort normal. No respiratory distress.  GI: She exhibits no distension. There is no tenderness. There is no rebound.  Musculoskeletal: Normal range of motion. She exhibits no edema.  Neurological: She is alert and oriented to person, place, and time.  Skin: Skin is warm and dry.  Psychiatric: She has a normal mood and affect. Her behavior is normal.    MAU Course  Procedures  MDM Patient presents to MAU for follow-up after she did not complete workup for vaginal bleeding in the first trimester several days ago when she was here. With no bleeding since but unfinished workup ultrasound was ordered today which showed a 12 week 6 day intrauterine pregnancy with good fetal heart rate.  Assessment and Plan  #1: Normal IUP. Patient seen and has been taking prenatal vitamins has a normal IUP with no further bleeding. Okay for discharge home. She [facility] on [facility].  Ernestina Pennaicholas Amarya Kuehl 01/15/2017, 3:58 PM

## 2017-01-15 NOTE — Discharge Instructions (Signed)
First Trimester of Pregnancy The first trimester of pregnancy is from week 1 until the end of week 13 (months 1 through 3). During this time, your baby will begin to develop inside you. At 6-8 weeks, the eyes and face are formed, and the heartbeat can be seen on ultrasound. At the end of 12 weeks, all the baby's organs are formed. Prenatal care is all the medical care you receive before the birth of your baby. Make sure you get good prenatal care and follow all of your doctor's instructions. Follow these instructions at home: Medicines  Take over-the-counter and prescription medicines only as told by your doctor. Some medicines are safe and some medicines are not safe during pregnancy.  Take a prenatal vitamin that contains at least 600 micrograms (mcg) of folic acid.  If you have trouble pooping (constipation), take medicine that will make your stool soft (stool softener) if your doctor approves. Eating and drinking  Eat regular, healthy meals.  Your doctor will tell you the amount of weight gain that is right for you.  Avoid raw meat and uncooked cheese.  If you feel sick to your stomach (nauseous) or throw up (vomit): ? Eat 4 or 5 small meals a day instead of 3 large meals. ? Try eating a few soda crackers. ? Drink liquids between meals instead of during meals.  To prevent constipation: ? Eat foods that are high in fiber, like fresh fruits and vegetables, whole grains, and beans. ? Drink enough fluids to keep your pee (urine) clear or pale yellow. Activity  Exercise only as told by your doctor. Stop exercising if you have cramps or pain in your lower belly (abdomen) or low back.  Do not exercise if it is too hot, too humid, or if you are in a place of great height (high altitude).  Try to avoid standing for long periods of time. Move your legs often if you must stand in one place for a long time.  Avoid heavy lifting.  Wear low-heeled shoes. Sit and stand up straight.  You  can have sex unless your doctor tells you not to. Relieving pain and discomfort  Wear a good support bra if your breasts are sore.  Take warm water baths (sitz baths) to soothe pain or discomfort caused by hemorrhoids. Use hemorrhoid cream if your doctor says it is okay.  Rest with your legs raised if you have leg cramps or low back pain.  If you have puffy, bulging veins (varicose veins) in your legs: ? Wear support hose or compression stockings as told by your doctor. ? Raise (elevate) your feet for 15 minutes, 3-4 times a day. ? Limit salt in your food. Prenatal care  Schedule your prenatal visits by the twelfth week of pregnancy.  Write down your questions. Take them to your prenatal visits.  Keep all your prenatal visits as told by your doctor. This is important. Safety  Wear your seat belt at all times when driving.  Make a list of emergency phone numbers. The list should include numbers for family, friends, the hospital, and police and fire departments. General instructions  Ask your doctor for a referral to a local prenatal class. Begin classes no later than at the start of month 6 of your pregnancy.  Ask for help if you need counseling or if you need help with nutrition. Your doctor can give you advice or tell you where to go for help.  Do not use hot tubs, steam rooms, or   saunas.  Do not douche or use tampons or scented sanitary pads.  Do not cross your legs for long periods of time.  Avoid all herbs and alcohol. Avoid drugs that are not approved by your doctor.  Do not use any tobacco products, including cigarettes, chewing tobacco, and electronic cigarettes. If you need help quitting, ask your doctor. You may get counseling or other support to help you quit.  Avoid cat litter boxes and soil used by cats. These carry germs that can cause birth defects in the baby and can cause a loss of your baby (miscarriage) or stillbirth.  Visit your dentist. At home, brush  your teeth with a soft toothbrush. Be gentle when you floss. Contact a doctor if:  You are dizzy.  You have mild cramps or pressure in your lower belly.  You have a nagging pain in your belly area.  You continue to feel sick to your stomach, you throw up, or you have watery poop (diarrhea).  You have a bad smelling fluid coming from your vagina.  You have pain when you pee (urinate).  You have increased puffiness (swelling) in your face, hands, legs, or ankles. Get help right away if:  You have a fever.  You are leaking fluid from your vagina.  You have spotting or bleeding from your vagina.  You have very bad belly cramping or pain.  You gain or lose weight rapidly.  You throw up blood. It may look like coffee grounds.  You are around people who have German measles, fifth disease, or chickenpox.  You have a very bad headache.  You have shortness of breath.  You have any kind of trauma, such as from a fall or a car accident. Summary  The first trimester of pregnancy is from week 1 until the end of week 13 (months 1 through 3).  To take care of yourself and your unborn baby, you will need to eat healthy meals, take medicines only if your doctor tells you to do so, and do activities that are safe for you and your baby.  Keep all follow-up visits as told by your doctor. This is important as your doctor will have to ensure that your baby is healthy and growing well. This information is not intended to replace advice given to you by your health care provider. Make sure you discuss any questions you have with your health care provider. Document Released: 04/13/2008 Document Revised: 11/03/2016 Document Reviewed: 11/03/2016 Elsevier Interactive Patient Education  2017 [employer]  

## 2017-01-15 NOTE — MAU Note (Signed)
Patient was seen yesterday had to leave, was waiting on bloodwork results, no vaginal bleeding today having cramps.

## 2017-02-08 LAB — OB RESULTS CONSOLE GC/CHLAMYDIA
Chlamydia: NEGATIVE
Gonorrhea: NEGATIVE

## 2017-02-08 LAB — OB RESULTS CONSOLE ABO/RH: RH Type: POSITIVE

## 2017-02-08 LAB — OB RESULTS CONSOLE ANTIBODY SCREEN: ANTIBODY SCREEN: NEGATIVE

## 2017-02-08 LAB — OB RESULTS CONSOLE RPR: RPR: NONREACTIVE

## 2017-02-08 LAB — OB RESULTS CONSOLE HIV ANTIBODY (ROUTINE TESTING): HIV: NONREACTIVE

## 2017-02-08 LAB — OB RESULTS CONSOLE RUBELLA ANTIBODY, IGM: RUBELLA: IMMUNE

## 2017-02-08 LAB — OB RESULTS CONSOLE HEPATITIS B SURFACE ANTIGEN: Hepatitis B Surface Ag: NEGATIVE

## 2017-06-21 LAB — OB RESULTS CONSOLE GBS: STREP GROUP B AG: POSITIVE

## 2017-06-21 LAB — OB RESULTS CONSOLE GC/CHLAMYDIA
CHLAMYDIA, DNA PROBE: NEGATIVE
GC PROBE AMP, GENITAL: NEGATIVE

## 2017-07-26 ENCOUNTER — Telehealth (HOSPITAL_COMMUNITY): Payer: Self-pay | Admitting: *Deleted

## 2017-07-26 ENCOUNTER — Encounter (HOSPITAL_COMMUNITY): Payer: Self-pay | Admitting: *Deleted

## 2017-07-26 NOTE — Telephone Encounter (Signed)
Preadmission screen  

## 2017-07-29 ENCOUNTER — Inpatient Hospital Stay (HOSPITAL_COMMUNITY): Payer: Medicaid Other | Admitting: Anesthesiology

## 2017-07-29 ENCOUNTER — Inpatient Hospital Stay (HOSPITAL_COMMUNITY)
Admission: AD | Admit: 2017-07-29 | Discharge: 2017-07-31 | DRG: 775 | Disposition: A | Discharge status: Home / Self Care | Payer: Medicaid Other | Source: Ambulatory Visit | Attending: Obstetrics & Gynecology | Admitting: Obstetrics & Gynecology

## 2017-07-29 ENCOUNTER — Other Ambulatory Visit: Payer: Self-pay | Admitting: Advanced Practice Midwife

## 2017-07-29 ENCOUNTER — Encounter (HOSPITAL_COMMUNITY): Payer: Self-pay

## 2017-07-29 DIAGNOSIS — O99824 Streptococcus B carrier state complicating childbirth: Secondary | ICD-10-CM | POA: Diagnosis present

## 2017-07-29 DIAGNOSIS — O26893 Other specified pregnancy related conditions, third trimester: Secondary | ICD-10-CM | POA: Diagnosis present

## 2017-07-29 DIAGNOSIS — Z3A40 40 weeks gestation of pregnancy: Secondary | ICD-10-CM

## 2017-07-29 LAB — COMPREHENSIVE METABOLIC PANEL
ALK PHOS: 168 U/L — AB (ref 38–126)
ALT: 12 U/L — AB (ref 14–54)
AST: 22 U/L (ref 15–41)
Albumin: 3 g/dL — ABNORMAL LOW (ref 3.5–5.0)
Anion gap: 10 (ref 5–15)
BUN: 9 mg/dL (ref 6–20)
CALCIUM: 9 mg/dL (ref 8.9–10.3)
CO2: 19 mmol/L — AB (ref 22–32)
CREATININE: 0.56 mg/dL (ref 0.44–1.00)
Chloride: 107 mmol/L (ref 101–111)
Glucose, Bld: 106 mg/dL — ABNORMAL HIGH (ref 65–99)
Potassium: 4.3 mmol/L (ref 3.5–5.1)
Sodium: 136 mmol/L (ref 135–145)
Total Bilirubin: 0.6 mg/dL (ref 0.3–1.2)
Total Protein: 6.8 g/dL (ref 6.5–8.1)

## 2017-07-29 LAB — TYPE AND SCREEN
ABO/RH(D): O POS
Antibody Screen: NEGATIVE

## 2017-07-29 LAB — CBC
HCT: 34.8 % — ABNORMAL LOW (ref 36.0–46.0)
Hemoglobin: 11.9 g/dL — ABNORMAL LOW (ref 12.0–15.0)
MCH: 29 pg (ref 26.0–34.0)
MCHC: 34.2 g/dL (ref 30.0–36.0)
MCV: 84.9 fL (ref 78.0–100.0)
PLATELETS: 366 10*3/uL (ref 150–400)
RBC: 4.1 MIL/uL (ref 3.87–5.11)
RDW: 15.2 % (ref 11.5–15.5)
WBC: 13.6 10*3/uL — AB (ref 4.0–10.5)

## 2017-07-29 LAB — RPR: RPR: NONREACTIVE

## 2017-07-29 MED ORDER — PRENATAL MULTIVITAMIN CH
1.0000 | ORAL_TABLET | Freq: Every day | ORAL | Status: DC
  Administered 2017-07-30 – 2017-07-31 (×2): 1 via ORAL
  Filled 2017-07-29 (×2): qty 1

## 2017-07-29 MED ORDER — IBUPROFEN 600 MG PO TABS
600.0000 mg | ORAL_TABLET | Freq: Four times a day (QID) | ORAL | Status: DC
  Administered 2017-07-29 – 2017-07-31 (×7): 600 mg via ORAL
  Filled 2017-07-29 (×7): qty 1

## 2017-07-29 MED ORDER — SENNOSIDES-DOCUSATE SODIUM 8.6-50 MG PO TABS
2.0000 | ORAL_TABLET | ORAL | Status: DC
  Administered 2017-07-29 – 2017-07-30 (×2): 2 via ORAL
  Filled 2017-07-29 (×2): qty 2

## 2017-07-29 MED ORDER — FENTANYL 2.5 MCG/ML BUPIVACAINE 1/10 % EPIDURAL INFUSION (WH - ANES)
14.0000 mL/h | INTRAMUSCULAR | Status: DC | PRN
Start: 2017-07-29 — End: 2017-07-29
  Administered 2017-07-29: 14 mL/h via EPIDURAL
  Filled 2017-07-29: qty 100

## 2017-07-29 MED ORDER — PENICILLIN G POT IN DEXTROSE 60000 UNIT/ML IV SOLN
3.0000 10*6.[IU] | INTRAVENOUS | Status: DC
  Filled 2017-07-29 (×2): qty 50

## 2017-07-29 MED ORDER — ZOLPIDEM TARTRATE 5 MG PO TABS: 5.0000 mg | ORAL_TABLET | Freq: Every evening | ORAL | Status: DC | PRN

## 2017-07-29 MED ORDER — DIPHENHYDRAMINE HCL 50 MG/ML IJ SOLN: 12.5000 mg | INTRAMUSCULAR | Status: DC | PRN

## 2017-07-29 MED ORDER — LACTATED RINGERS IV SOLN
500.0000 mL | Freq: Once | INTRAVENOUS | Status: AC
  Administered 2017-07-29: 500 mL via INTRAVENOUS

## 2017-07-29 MED ORDER — ONDANSETRON HCL 4 MG/2ML IJ SOLN: 4.0000 mg | Freq: Four times a day (QID) | INTRAMUSCULAR | Status: DC | PRN

## 2017-07-29 MED ORDER — TETANUS-DIPHTH-ACELL PERTUSSIS 5-2.5-18.5 LF-MCG/0.5 IM SUSP: 0.5000 mL | Freq: Once | INTRAMUSCULAR | Status: DC

## 2017-07-29 MED ORDER — EPHEDRINE 5 MG/ML INJ
10.0000 mg | INTRAVENOUS | Status: DC | PRN
  Filled 2017-07-29: qty 2

## 2017-07-29 MED ORDER — OXYTOCIN 40 UNITS IN LACTATED RINGERS INFUSION - SIMPLE MED
INTRAVENOUS | Status: AC
  Filled 2017-07-29: qty 1000

## 2017-07-29 MED ORDER — ACETAMINOPHEN 325 MG PO TABS: 650.0000 mg | ORAL_TABLET | ORAL | Status: DC | PRN

## 2017-07-29 MED ORDER — OXYTOCIN 40 UNITS IN LACTATED RINGERS INFUSION - SIMPLE MED
1.0000 m[IU]/min | INTRAVENOUS | Status: DC
  Administered 2017-07-29: 2 m[IU]/min via INTRAVENOUS

## 2017-07-29 MED ORDER — AMPICILLIN SODIUM 1 G IJ SOLR
1.0000 g | INTRAMUSCULAR | Status: DC
  Administered 2017-07-29: 1 g via INTRAVENOUS
  Filled 2017-07-29 (×4): qty 1000

## 2017-07-29 MED ORDER — LACTATED RINGERS IV SOLN
INTRAVENOUS | Status: DC
  Administered 2017-07-29 (×2): via INTRAVENOUS

## 2017-07-29 MED ORDER — FENTANYL CITRATE (PF) 100 MCG/2ML IJ SOLN
INTRAMUSCULAR | Status: AC
  Administered 2017-07-29: 100 ug
  Filled 2017-07-29: qty 2

## 2017-07-29 MED ORDER — OXYCODONE-ACETAMINOPHEN 5-325 MG PO TABS: 2.0000 | ORAL_TABLET | ORAL | Status: DC | PRN

## 2017-07-29 MED ORDER — OXYCODONE-ACETAMINOPHEN 5-325 MG PO TABS: 1.0000 | ORAL_TABLET | ORAL | Status: DC | PRN

## 2017-07-29 MED ORDER — DIBUCAINE 1 % RE OINT: 1.0000 "application " | TOPICAL_OINTMENT | RECTAL | Status: DC | PRN

## 2017-07-29 MED ORDER — PENICILLIN G POTASSIUM 5000000 UNITS IJ SOLR
5.0000 10*6.[IU] | Freq: Once | INTRAMUSCULAR | Status: AC
  Administered 2017-07-29: 5 10*6.[IU] via INTRAVENOUS
  Filled 2017-07-29: qty 5

## 2017-07-29 MED ORDER — COCONUT OIL OIL: 1.0000 "application " | TOPICAL_OIL | Status: DC | PRN

## 2017-07-29 MED ORDER — SODIUM CHLORIDE 0.9 % IV SOLN
2.0000 g | Freq: Once | INTRAVENOUS | Status: AC
  Administered 2017-07-29: 2 g via INTRAVENOUS
  Filled 2017-07-29: qty 2000

## 2017-07-29 MED ORDER — PHENYLEPHRINE 40 MCG/ML (10ML) SYRINGE FOR IV PUSH (FOR BLOOD PRESSURE SUPPORT): 80.0000 ug | PREFILLED_SYRINGE | INTRAVENOUS | Status: DC | PRN

## 2017-07-29 MED ORDER — TERBUTALINE SULFATE 1 MG/ML IJ SOLN
0.2500 mg | Freq: Once | INTRAMUSCULAR | Status: DC | PRN
  Filled 2017-07-29: qty 1

## 2017-07-29 MED ORDER — EPHEDRINE 5 MG/ML INJ: 10.0000 mg | INTRAVENOUS | Status: DC | PRN

## 2017-07-29 MED ORDER — LACTATED RINGERS IV SOLN: 500.0000 mL | INTRAVENOUS | Status: DC | PRN

## 2017-07-29 MED ORDER — LIDOCAINE HCL (PF) 1 % IJ SOLN
INTRAMUSCULAR | Status: AC
  Filled 2017-07-29: qty 30

## 2017-07-29 MED ORDER — BENZOCAINE-MENTHOL 20-0.5 % EX AERO: 1.0000 "application " | INHALATION_SPRAY | CUTANEOUS | Status: DC | PRN

## 2017-07-29 MED ORDER — WITCH HAZEL-GLYCERIN EX PADS: 1.0000 "application " | MEDICATED_PAD | CUTANEOUS | Status: DC | PRN

## 2017-07-29 MED ORDER — PHENYLEPHRINE 40 MCG/ML (10ML) SYRINGE FOR IV PUSH (FOR BLOOD PRESSURE SUPPORT)
80.0000 ug | PREFILLED_SYRINGE | INTRAVENOUS | Status: DC | PRN
  Filled 2017-07-29: qty 5

## 2017-07-29 MED ORDER — SIMETHICONE 80 MG PO CHEW: 80.0000 mg | CHEWABLE_TABLET | ORAL | Status: DC | PRN

## 2017-07-29 MED ORDER — LIDOCAINE HCL (PF) 1 % IJ SOLN
30.0000 mL | INTRAMUSCULAR | Status: DC | PRN
  Filled 2017-07-29: qty 30

## 2017-07-29 MED ORDER — SOD CITRATE-CITRIC ACID 500-334 MG/5ML PO SOLN: 30.0000 mL | ORAL | Status: DC | PRN

## 2017-07-29 MED ORDER — LIDOCAINE HCL (PF) 1 % IJ SOLN
INTRAMUSCULAR | Status: DC | PRN
  Administered 2017-07-29 (×2): 5 mL via EPIDURAL

## 2017-07-29 MED ORDER — ONDANSETRON HCL 4 MG PO TABS: 4.0000 mg | ORAL_TABLET | ORAL | Status: DC | PRN

## 2017-07-29 MED ORDER — LACTATED RINGERS IV SOLN: 500.0000 mL | Freq: Once | INTRAVENOUS | Status: DC

## 2017-07-29 MED ORDER — ONDANSETRON HCL 4 MG/2ML IJ SOLN: 4.0000 mg | INTRAMUSCULAR | Status: DC | PRN

## 2017-07-29 MED ORDER — OXYTOCIN BOLUS FROM INFUSION
500.0000 mL | Freq: Once | INTRAVENOUS | Status: AC
  Administered 2017-07-29: 500 mL via INTRAVENOUS

## 2017-07-29 MED ORDER — PHENYLEPHRINE 40 MCG/ML (10ML) SYRINGE FOR IV PUSH (FOR BLOOD PRESSURE SUPPORT)
80.0000 ug | PREFILLED_SYRINGE | INTRAVENOUS | Status: DC | PRN
  Filled 2017-07-29: qty 10
  Filled 2017-07-29: qty 5

## 2017-07-29 MED ORDER — FENTANYL CITRATE (PF) 100 MCG/2ML IJ SOLN
100.0000 ug | INTRAMUSCULAR | Status: DC | PRN
  Administered 2017-07-29 (×3): 100 ug via INTRAVENOUS
  Filled 2017-07-29 (×3): qty 2

## 2017-07-29 MED ORDER — DIPHENHYDRAMINE HCL 25 MG PO CAPS: 25.0000 mg | ORAL_CAPSULE | Freq: Four times a day (QID) | ORAL | Status: DC | PRN

## 2017-07-29 MED ORDER — FLEET ENEMA 7-19 GM/118ML RE ENEM: 1.0000 | ENEMA | RECTAL | Status: DC | PRN

## 2017-07-29 MED ORDER — OXYTOCIN 40 UNITS IN LACTATED RINGERS INFUSION - SIMPLE MED
2.5000 [IU]/h | INTRAVENOUS | Status: DC
  Filled 2017-07-29: qty 1000

## 2017-07-29 NOTE — MAU Note (Signed)
Pt c/o contractions every 5 mins. Pt denies LOF or vaginal bleeding. Reports good fetal movement. States cervix was 1cm on last exam.

## 2017-07-29 NOTE — Anesthesia Preprocedure Evaluation (Signed)
Anesthesia Evaluation  Patient identified by MRN, date of birth, ID band Patient awake    Reviewed: Allergy & Precautions, H&P , NPO status , Patient's Chart, lab work & pertinent test results, reviewed documented beta blocker date and time   Airway Mallampati: II  TM Distance: >3 FB Neck ROM: full    Dental no notable dental hx.    Pulmonary neg pulmonary ROS,    Pulmonary exam normal breath sounds clear to auscultation       Cardiovascular negative cardio ROS Normal cardiovascular exam Rhythm:regular Rate:Normal     Neuro/Psych negative neurological ROS  negative psych ROS   GI/Hepatic negative GI ROS, Neg liver ROS,   Endo/Other  negative endocrine ROS  Renal/GU negative Renal ROS  negative genitourinary   Musculoskeletal   Abdominal   Peds  Hematology negative hematology ROS (+)   Anesthesia Other Findings   Reproductive/Obstetrics (+) Pregnancy                             Anesthesia Physical Anesthesia Plan  ASA: III  Anesthesia Plan: Epidural   Post-op Pain Management:    Induction:   PONV Risk Score and Plan:   Airway Management Planned:   Additional Equipment:   Intra-op Plan:   Post-operative Plan:   Informed Consent: I have reviewed the patients History and Physical, chart, labs and discussed the procedure including the risks, benefits and alternatives for the proposed anesthesia with the patient or authorized representative who has indicated his/her understanding and acceptance.     Plan Discussed with:   Anesthesia Plan Comments:         Anesthesia Quick Evaluation  

## 2017-07-29 NOTE — H&P (Signed)
OBSTETRIC ADMISSION HISTORY AND PHYSICAL  Shelley Simmons is a 21 y.o. female G3P1011 with IUP at [redacted]w[redacted]d by LMP c/w 12w [place] presenting for SOL. She reports +FMs, No LOF, no VB, no blurry vision, headaches or peripheral edema, and RUQ pain.  She plans on breast feeding. She would like OCP or Pops for birth control. She received her prenatal care at [facility]   Dating: By LMP c/w 12 [place] --->  Estimated Date of Delivery: 07/26/17  Sono:   , CWD, normal anatomy, breech presentation   Prenatal History/Complications:  Past Medical History: Past Medical History:  Diagnosis Date  . Medical history non-contributory     Past Surgical History: Past Surgical History:  Procedure Laterality Date  . NO PAST SURGERIES      Obstetrical History: OB History    Gravida Para Term Preterm AB Living   SAB TAB Ectopic Multiple Live Births     1     1      Social History: Social History   Social History  . Marital status: Single    Spouse name: N/A  . Number of children: N/A  . Years of education: N/A   Social History Main Topics  . Smoking status: Never Smoker  . Smokeless tobacco: Never Used  . Alcohol use No  . Drug use: No  . Sexual activity: Yes   Other Topics Concern  . None   Social History Narrative  . None    Family History: No family history on file.  Allergies: Allergies  Allergen Reactions  . 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 Prenatal Vit-Fe Fumarate-FA (PRENATAL MULTIVITAMIN) TABS tablet Take 1 tablet by mouth daily at 12 noon.   07/28/2017 at Unknown time     Review of Systems   All systems reviewed and negative except as stated in HPI  Blood pressure (!) 140/96, pulse 92, resp. rate 20, last menstrual period 10/19/2016, SpO2 98 %, unknown if currently breastfeeding. General appearance: alert, cooperative and no distress Lungs: clear to auscultation  bilaterally Heart: regular rate and rhythm Abdomen: soft, non-tender; bowel sounds normal Pelvic: adequate Extremities: Homans sign is negative, no sign of DVT Presentation: cephalic Fetal monitoringBaseline: 140 bpm, Variability: Good {> 6 bpm), Accelerations: Reactive and Decelerations: Absent Uterine activity every 2-3 min Dilation: 5.5 Effacement (%): 80, 90 Station: -2, -1 Exam by:: Camelia Eng RN   Prenatal labs: ABO, Rh: O/Positive/-- (04/02 0000) Antibody: Negative (04/02 0000) Rubella: Immune (04/02 0000) RPR: Nonreactive (04/02 0000)  HBsAg: Negative (04/02 0000)  HIV: Non-reactive (04/02 0000)  GBS: Positive (08/13 0000)  1 hr Glucola normal Genetic screening  negative Anatomy US normal  Prenatal Transfer Tool  Maternal Diabetes: No Genetic Screening: Normal Maternal Ultrasounds/Referrals: Normal Fetal Ultrasounds or other Referrals:  None Maternal Substance Abuse:  No Significant Maternal Medications:  None Significant Maternal Lab Results: Lab values include: Group B Strep positive  No results found for this or any previous visit (from the past 24 hour(s)).  Patient Active Problem List   Diagnosis Date Noted  . Indication for care in labor or delivery 07/29/2017  . NSVD (normal spontaneous vaginal delivery) 08/26/2014  . Active labor at term 08/25/2014    Assessment/Plan:  Shelley Simmons is a 22 y.o. G3P1011 at [redacted]w[redacted]d here for SOL with change from 4>5.5 over one hour.  #Labor: progressing with expectant management #  Pain: Desires Fentanyl only #FWB:  category 1, reassuring #ID:  GBS positive - ampicillin #MOF: breast #MOC: OCP vs. Pops #Circ:  declines  Burnard Leigh, MD  07/29/2017, 6:12 AM  CNM attestation:  I have seen and examined this patient; I agree with above documentation in the resident's note.   Shelley Simmons is a 21 y.o. G3P1011 here for SOL  PE: BP (!) 140/96   Pulse 92   Temp 98.1 F (36.7 C) (Oral)    Resp 18   Ht  (1.549 m)   Wt 94.8 kg (209 lb)   LMP 10/19/2016   SpO2 98%   BMI 39.49 kg/m   Resp: normal effort, no distress Abd: gravid  ROS, labs, PMH reviewed  Plan: Admit to [employer] Expectant management Order CMET and P/C ratio; watch additional BPs Amp for GBS ppx and multip status Anticipate SVD  SHAW, KIMBERLY CNM 07/29/2017, 10:08 AM

## 2017-07-29 NOTE — Anesthesia Procedure Notes (Signed)
Epidural Patient location during procedure: OB Start time: 07/29/2017 11:55 AM End time: 07/29/2017 12:05 PM  Staffing Anesthesiologist: Laron Boorman  Preanesthetic Checklist Completed: patient identified, site marked, surgical consent, pre-op evaluation, timeout performed, IV checked, risks and benefits discussed and monitors and equipment checked  Epidural Patient position: sitting Prep: site prepped and draped and DuraPrep Patient monitoring: continuous pulse ox and blood pressure Approach: midline Location: L4-L5 Injection technique: LOR air  Needle:  Needle type: Tuohy  Needle gauge: 17 G Needle length: 9 cm and 9 Needle insertion depth: 6 cm Catheter type: closed end flexible Catheter size: 19 Gauge Catheter at skin depth: 11 cm Test dose: negative  Assessment Events: blood not aspirated, injection not painful, no injection resistance, negative IV test and no paresthesia

## 2017-07-29 NOTE — Anesthesia Pain Management Evaluation Note (Signed)
  CRNA Pain Management Visit Note  Patient: Shelley Simmons, 21 y.o., female  "Hello I am a member of the anesthesia team at [facility]. We have an anesthesia team available at all times to provide care throughout the hospital, including epidural management and anesthesia for C-section. I don't know your plan for the delivery whether it a natural birth, water birth, IV sedation, nitrous supplementation, doula or epidural, but we want to meet your pain goals."   1.Was your pain managed to your expectations on prior hospitalizations?   Yes   2.What is your expectation for pain management during this hospitalization?     Labor support without medications  3.How can we help you reach that goal? Pt does not desire epidural at this time  Record the patient's initial score and the patient's pain goal.   Pain: 6  Pain Goal: 10 [facility] wants you to be able to say your pain was always managed very well.  Shelley Simmons 07/29/2017

## 2017-07-29 NOTE — Progress Notes (Signed)
Labor Progress Note  Shelley Simmons is a 21 y.o. G3P1011 at [redacted]w[redacted]d  admitted for SOL.  S: Patient is uncomfortable with contractions but able to tolerate pain. She does not want epidural.   O:  BP (!) 140/96   Pulse 92   Temp 98.1 F (36.7 C) (Oral)   Resp 18   Ht  (1.549 m)   Wt 94.8 kg (209 lb)   LMP 10/19/2016   SpO2 98%   BMI 39.49 kg/m   No intake/output data recorded.  FHT:  FHR: 140 bpm, variability: moderate,  accelerations:  Present,  decelerations:  Present early UC:   regular, every 3-4 minutes SVE:   Dilation: 8 Effacement (%): 100 Station: -1 Exam by:: Foley,rn Membranes intact  Labs: Lab Results  Component Value Date   WBC 13.6 (H) 07/29/2017   HGB 11.9 (L) 07/29/2017   HCT 34.8 (L) 07/29/2017   MCV 84.9 07/29/2017   PLT 366 07/29/2017    Assessment / Plan: 21 y.o. G3P1011 [redacted]w[redacted]d in active labor Spontaneous labor, progressing normally  Labor: Progressing normally, can AROM once she has adequate antibiotic prophylaxis Fetal Wellbeing:  Category I Pain Control:  IV pain meds and Nitrous Oxide Anticipated MOD:  NSVD  Expectant management   Caryl Ada, DO OB Fellow 07/29/2017, 9:41 AM

## 2017-07-29 NOTE — Anesthesia Postprocedure Evaluation (Signed)
Anesthesia Post Note  Patient: Shelley Simmons  Procedure(s) Performed: * No procedures listed *     Patient location during evaluation: Mother Baby Anesthesia Type: Epidural Level of consciousness: awake and alert Pain management: pain level controlled Vital Signs Assessment: post-procedure vital signs reviewed and stable Respiratory status: spontaneous breathing, nonlabored ventilation and respiratory function stable Cardiovascular status: stable Postop Assessment: no headache, no backache and epidural receding Anesthetic complications: no    Last Vitals:  Vitals:   07/29/17 1846 07/29/17 1905  BP:  (!) 123/58  Pulse:  88  Resp: 18 18  Temp:  37.1 C  SpO2:      Last Pain:  Vitals:   07/29/17 1905  TempSrc: Axillary  PainSc: 0-No pain                 Dodie Parisi

## 2017-07-29 NOTE — Progress Notes (Addendum)
Patient evaluated around 1100 (this is delayed documentation) and very uncomfortable with contractions.  She is now requesting epidural.  She is s/p first does of Ampicillin, second dose is initiated now.    Category 1 tracing.   AROM with return of clear fluid @ 1107.  Start pitocin 2x2 for augmentation.    Expect NSVD.   West Pugh, DO PGY-2 Family Medicine Resident

## 2017-07-30 NOTE — Progress Notes (Signed)
Post Partum Day 1 Subjective: G3P3003 now PPD#1 s/p SVD without complication.  Pain well controlled.  Moderate lochia.  Tolerating PO.  Ambulating with ease.  She is breast feeding without issue.   Objective: Blood pressure (!) 94/59, pulse 66, temperature 97.7 F (36.5 C), temperature source Oral, resp. rate 18, height  (1.448 m), weight 69.4 kg (153 lb), last menstrual period 10/21/2016, SpO2 100 %, unknown if currently breastfeeding.  Physical Exam:  General: alert and cooperative Lochia: appropriate Uterine Fundus: firm Incision: N/A DVT Evaluation: No evidence of DVT seen on physical exam.   Recent Labs (last 2 labs)    Recent Labs  07/28/17 1245  HGB 11.5*  HCT 33.3*      Assessment/Plan: G3P3003 now PPD#1 s/p SVD without complication.   Continue routine postpartum care.  Breast feeding.  Desires POPs for contraception.   Anticipate discharge tomorrow.  Could be discharged later this evening pending clinical status of infant.   LOS: 2 days   Larene Beach, DO PGY-2 Family Medicine Resident 07/30/2017, 7:41 AM    I have seen and examined this patient and agree with the management plan.

## 2017-07-30 NOTE — Lactation Note (Signed)
This note was copied from a baby's chart. Lactation Consultation Note  Patient Name: Shelley Simmons [id number] Date: 07/30/2017 Reason for consult: Follow-up assessment Baby at 23 hr of life. Upon entry baby was latched in cradle position to the L breast. Mom denies latch ir nipple pain, voiced no concerns. Discussed baby behavior, feeding frequency, baby belly size, voids, wt loss, breast changes, and nipple care. Demonstrated manual expression, colostrum noted bilaterally, spoon in room. Parents are aware of lactation services and support group.     Maternal Data    Feeding Feeding Type: Breast Fed Length of feed: 25 min  LATCH Score Latch: Grasps breast easily, tongue down, lips flanged, rhythmical sucking. (per mom)  Audible Swallowing: A few with stimulation  Type of Nipple: Everted at rest and after stimulation  Comfort (Breast/Nipple): Soft / non-tender  Hold (Positioning): Assistance needed to correctly position infant at breast and maintain latch. (per mom)  LATCH Score: 8  Interventions    Lactation Tools Discussed/Used     Consult Status Consult Status: Follow-up Date: 07/31/17 Follow-up type: In-patient    Rulon Eisenmenger 07/30/2017, 5:15 PM

## 2017-07-30 NOTE — Addendum Note (Signed)
Addendum  created 07/30/17 1449 by Shanon Payor, CRNA   Charge Capture section accepted, Sign clinical note

## 2017-07-30 NOTE — Anesthesia Postprocedure Evaluation (Signed)
Anesthesia Post Note  Patient: Wiletta Bermingham  Procedure(s) Performed: * No procedures listed *     Patient location during evaluation: Mother Baby Anesthesia Type: Epidural Level of consciousness: awake and alert and oriented Pain management: satisfactory to patient Vital Signs Assessment: post-procedure vital signs reviewed and stable Respiratory status: spontaneous breathing and nonlabored ventilation Cardiovascular status: stable Postop Assessment: no headache, no backache, no signs of nausea or vomiting, adequate PO intake and patient able to bend at knees (patient up walking) Anesthetic complications: no    Last Vitals:  Vitals:   07/29/17 2345 07/30/17 0748  BP: (!) 95/50 109/65  Pulse: 98 72  Resp: 18 18  Temp: 36.7 C 36.7 C  SpO2:  99%    Last Pain:  Vitals:   07/30/17 1006  TempSrc:   PainSc: 0-No pain   Pain Goal:                 Aadan Chenier

## 2017-07-30 NOTE — Lactation Note (Signed)
This note was copied from a baby's chart. Lactation Consultation Note Mom's 2nd baby. Her 1st child now 21 yr old BF for 1 yr. W/o difficulty. Mom states this baby feeding well. Occasionally uncomfortable, but re-latches and fine. Discussed chin tug. Educated newborn behavior, STS, I&O. Mom encouraged to feed baby 8-12 times/24 hours and with feeding cues.  Encouraged to call for assistance if needed. Mom states she has no questions or needs at this time. WH/LC brochure given w/resources, support groups and LC services.  Patient Name: Shelley Simmons [id number] Date: 07/30/2017 Reason for consult: Initial assessment   Maternal Data Has patient been taught Hand Expression?: Yes Does the patient have breastfeeding experience prior to this delivery?: Yes  Feeding Feeding Type:  (encouraged mother to call for latch assistance) Length of feed: 7 min  LATCH Score                   Interventions Interventions: Breast feeding basics reviewed  Lactation Tools Discussed/Used WIC Program: No   Consult Status Consult Status: Follow-up Date: 07/31/17 Follow-up type: In-patient    Lesley Atkin, Diamond Nickel 07/30/2017, 1:50 AM

## 2017-07-31 DIAGNOSIS — Z3A40 40 weeks gestation of pregnancy: Secondary | ICD-10-CM

## 2017-07-31 DIAGNOSIS — O99824 Streptococcus B carrier state complicating childbirth: Secondary | ICD-10-CM

## 2017-07-31 LAB — BIRTH TISSUE RECOVERY COLLECTION (PLACENTA DONATION)

## 2017-07-31 MED ORDER — ACETAMINOPHEN 325 MG PO TABS: 650.0000 mg | ORAL_TABLET | ORAL | 0 refills | Status: DC | PRN

## 2017-07-31 MED ORDER — IBUPROFEN 600 MG PO TABS: 600.0000 mg | ORAL_TABLET | Freq: Four times a day (QID) | ORAL | 0 refills | Status: DC

## 2017-07-31 NOTE — Discharge Instructions (Signed)
[facility] after your delivery.

## 2017-07-31 NOTE — Lactation Note (Signed)
This note was copied from a baby's chart. Lactation Consultation Note  Patient Name: Shelley Simmons [id number] Date: 07/31/2017 Reason for consult: Follow-up assessment   Baby 42 hours old and sleeping in FOB arms. Mom encouraged to feed baby 8-12 times/24 hours and with feeding cues.  Family denies questions or concerns. Reviewed engorgement care and monitoring voids/stools.    Maternal Data    Feeding Feeding Type: Breast Fed Length of feed: 21 min  LATCH Score Latch: Grasps breast easily, tongue down, lips flanged, rhythmical sucking.  Audible Swallowing: A few with stimulation  Type of Nipple: Everted at rest and after stimulation  Comfort (Breast/Nipple): Soft / non-tender  Hold (Positioning): No assistance needed to correctly position infant at breast.  LATCH Score: 9  Interventions Interventions: Skin to skin  Lactation Tools Discussed/Used     Consult Status Consult Status: Complete    Hardie Pulley 07/31/2017, 11:40 AM

## 2017-07-31 NOTE — Discharge Summary (Signed)
OB Discharge Summary     Patient Name: Shelley Simmons DOB: 05-02-1996 MRN: [id number]  Date of admission: 07/29/2017 Delivering MD: Rolm Bookbinder   Date of discharge: 07/31/2017  Admitting diagnosis: 40.3 WEEKS CTX Intrauterine pregnancy: [redacted]w[redacted]d     Secondary diagnosis:  Active Problems:   Indication for care in labor or delivery   Normal spontaneous vaginal delivery  Additional problems: None     Discharge diagnosis: Term Pregnancy Delivered                                                                                                Post partum procedures: None  Augmentation: AROM and Pitocin  Complications: None  Hospital course:  Onset of Labor With Vaginal Delivery     21 y.o. yo [id number] at [redacted]w[redacted]d was admitted in Latent Labor on 07/29/2017. Patient had an uncomplicated labor course as follows:  Membrane Rupture Time/Date: 11:07 AM ,07/29/2017   Intrapartum Procedures: Episiotomy: None [1]                                         Lacerations:  Labial [10]  Patient had a delivery of a Viable infant. 07/29/2017  Information for the patient's newborn:  Keaisha, Sublette [[id number]]  Delivery Method: Vag-Spont    Pateint had an uncomplicated postpartum course.  She is ambulating, tolerating a regular diet, passing flatus, and urinating well. Patient is discharged home in stable condition on 07/31/17.   Physical exam  Vitals:   07/29/17 2345 07/30/17 0748 07/30/17 1808 07/31/17 0614  BP: (!) 95/50 109/65 109/66 108/60  Pulse: 98 72 80 80  Resp: Temp: 98 F (36.7 C) 98 F (36.7 C) 98.4 F (36.9 C) 97.8 F (36.6 C)  TempSrc: Oral Oral Oral Oral  SpO2:  99%  100%  Weight:      Height:       General: alert, cooperative and no distress Lochia: appropriate Uterine Fundus: firm Incision: N/A DVT Evaluation: No evidence of DVT seen on physical exam. No significant calf/ankle edema. Labs: Lab Results  Component Value Date   WBC 13.6 (H) 07/29/2017    HGB 11.9 (L) 07/29/2017   HCT 34.8 (L) 07/29/2017   MCV 84.9 07/29/2017   PLT 366 07/29/2017   CMP Latest Ref Rng & Units 07/29/2017  Glucose 65 - 99 mg/dL 401(U)  BUN 6 - 20 mg/dL 9  Creatinine 2.72 - 5.36 mg/dL 6.44  Sodium 034 - 742 mmol/L 136  Potassium 3.5 - 5.1 mmol/L 4.3  Chloride 101 - 111 mmol/L 107  CO2 22 - 32 mmol/L 19(L)  Calcium 8.9 - 10.3 mg/dL 9.0  Total Protein 6.5 - 8.1 g/dL 6.8  Total Bilirubin 0.3 - 1.2 mg/dL 0.6  Alkaline Phos 38 - 126 U/L 168(H)  AST 15 - 41 U/L 22  ALT 14 - 54 U/L 12(L)    Discharge instruction: per After Visit Summary and "Baby and Me Booklet".  After visit meds:  Allergies  as of 07/31/2017      Reactions   Shrimp [shellfish Allergy] Anaphylaxis      Medication List    TAKE these medications   acetaminophen 325 MG tablet Commonly known as:  TYLENOL Take 2 tablets (650 mg total) by mouth every 4 (four) hours as needed (for pain scale < 4).   ibuprofen 600 MG tablet Commonly known as:  ADVIL,MOTRIN Take 1 tablet (600 mg total) by mouth every 6 (six) hours.   prenatal multivitamin Tabs tablet Take 1 tablet by mouth daily at 12 noon.            Discharge Care Instructions        Start     Ordered   07/31/17 0000  acetaminophen (TYLENOL) 325 MG tablet  Every 4 hours PRN     07/31/17 0715   07/31/17 0000  ibuprofen (ADVIL,MOTRIN) 600 MG tablet  Every 6 hours     07/31/17 0715   07/31/17 0000  Increase activity slowly     07/31/17 0715   07/31/17 0000  Diet - low sodium heart healthy     07/31/17 0715      Diet: routine diet  Activity: Advance as tolerated. Pelvic rest for 6 weeks.   Outpatient follow up: 6 weeks Follow up Appt:No future appointments. Follow up Visit:No Follow-up on file.  Postpartum contraception: Progesterone only pills  Newborn Data: Live born female  Birth Weight: 7 lb 7.4 oz (3385 g) APGAR: 8, 9  Baby Feeding: Breast Disposition: home with mother   07/31/2017 Burnard Leigh,  MD  CNM attestation I have seen and examined this patient and agree with above documentation in the resident's note.   Shelley Simmons is a 21 y.o. [id number] s/p SVD.   Pain is well controlled.  Plan for birth control is oral progesterone-only contraceptive.  Method of Feeding: breast  PE:  BP 108/60 (BP Location: Left Arm)   Pulse 80   Temp 97.8 F (36.6 C) (Oral)   Resp 18   Ht  (1.549 m)   Wt 94.8 kg (209 lb)   LMP 10/19/2016   SpO2 100%   Breastfeeding? Unknown   BMI 39.49 kg/m  Fundus firm  No results for input(s): HGB, HCT in the last 72 hours.   Plan: discharge today - postpartum care discussed - f/u clinic in 4 weeks for postpartum visit   SHAW, KIMBERLY, CNM 3:57 PM

## 2017-08-02 ENCOUNTER — Inpatient Hospital Stay (HOSPITAL_COMMUNITY): Admission: RE | Admit: 2017-08-02 | Payer: Self-pay | Source: Ambulatory Visit

## 2017-11-07 IMAGING — US US OB COMP LESS 14 WK
1 series · 15 of 24 positions shown · non-contrast
Comparison: None.

CLINICAL DATA: Spotting.  Cramping for 1 day

EXAM:
OBSTETRIC <14 WK ULTRASOUND
TECHNIQUE: Transvaginal ultrasound was performed for complete evaluation of the
gestation as well as the maternal uterus, adnexal regions, and
pelvic cul-de-sac.

[Series 1: us ob comp less 14 wk · 15 of 24 slices shown]
[im 1/24]
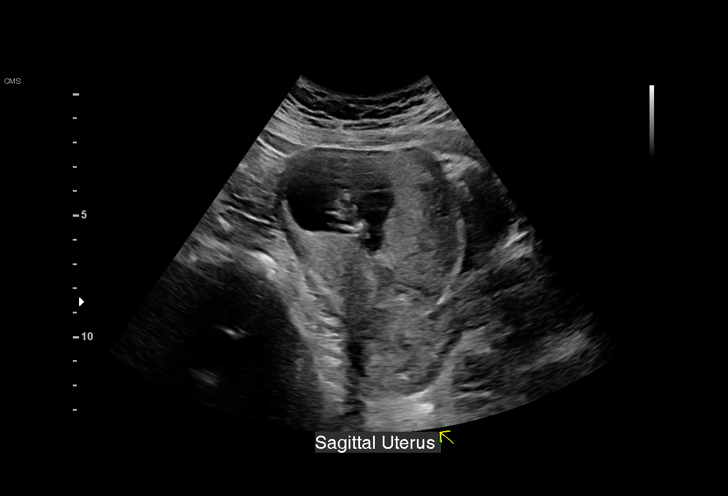
[im 3/24]
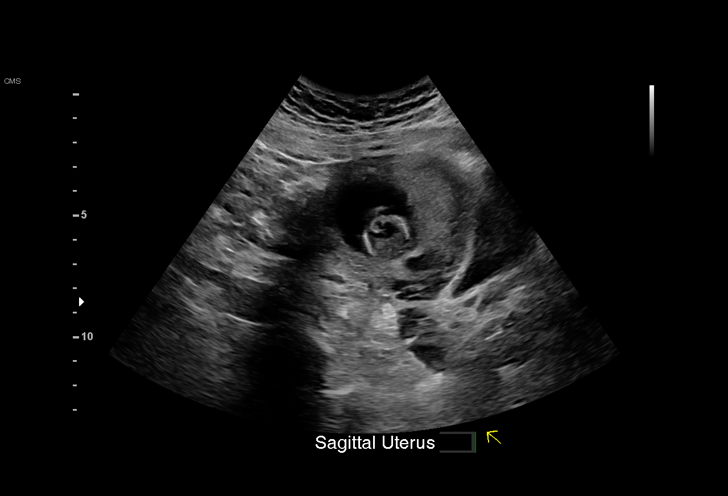
[im 5/24]
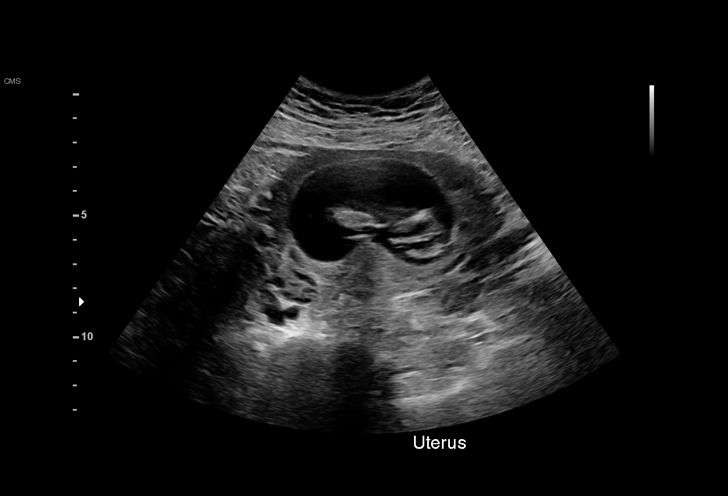
[im 6/24]
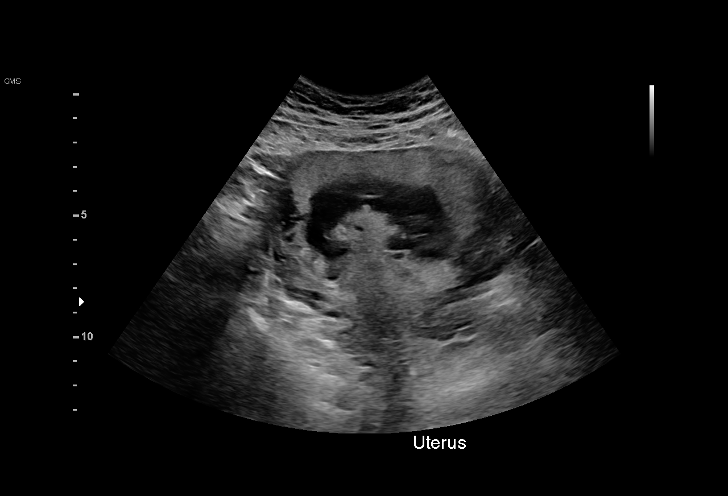
[im 8/24]
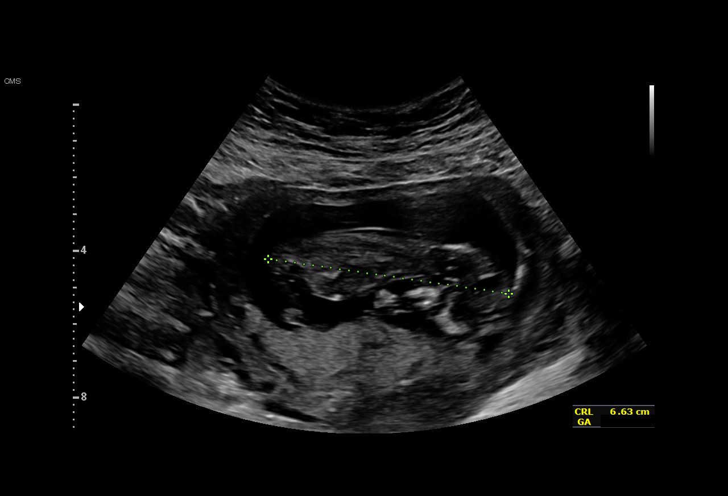
[im 9/24]
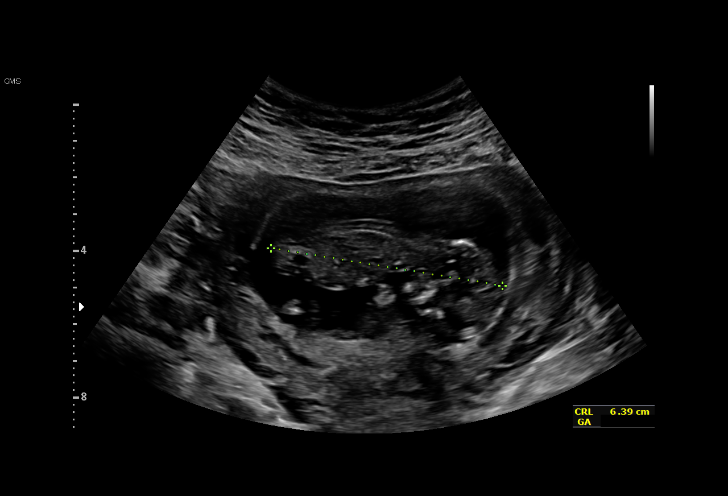
[im 11/24]
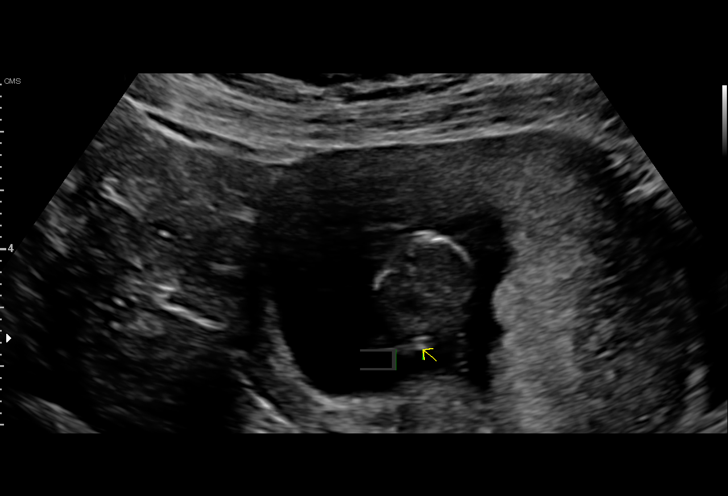
[im 13/24]
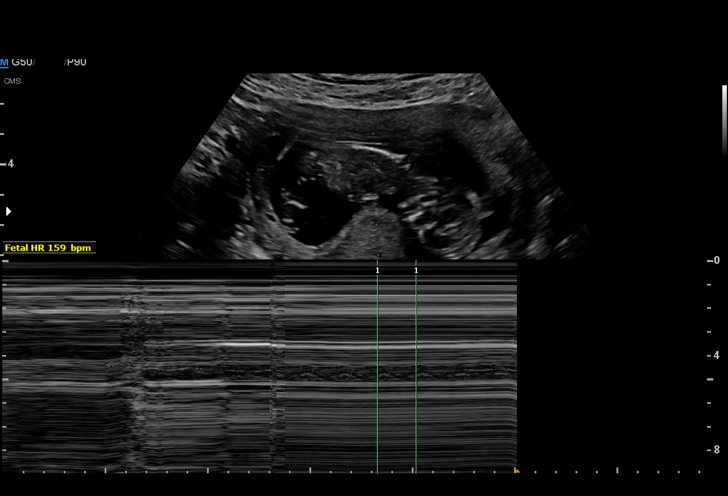
[im 14/24]
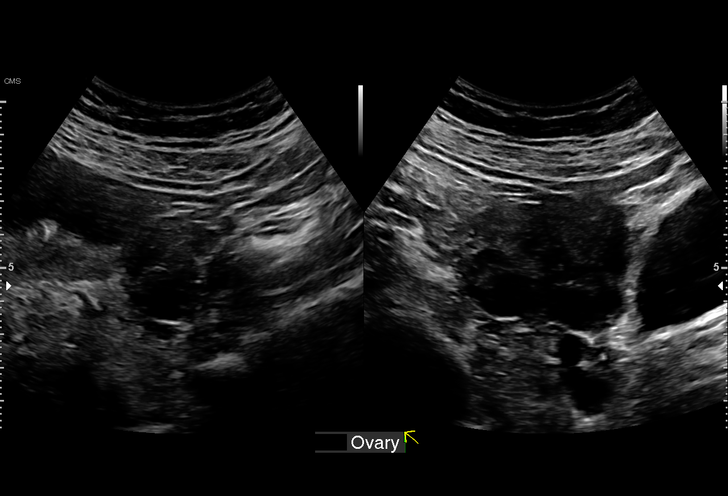
[im 16/24]
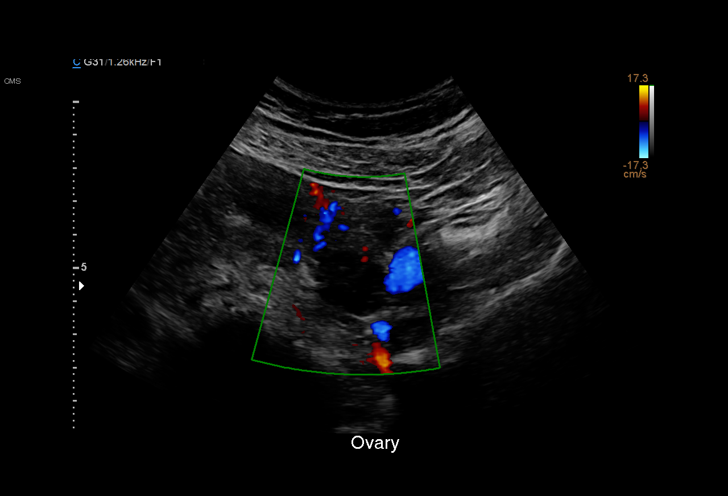
[im 17/24]
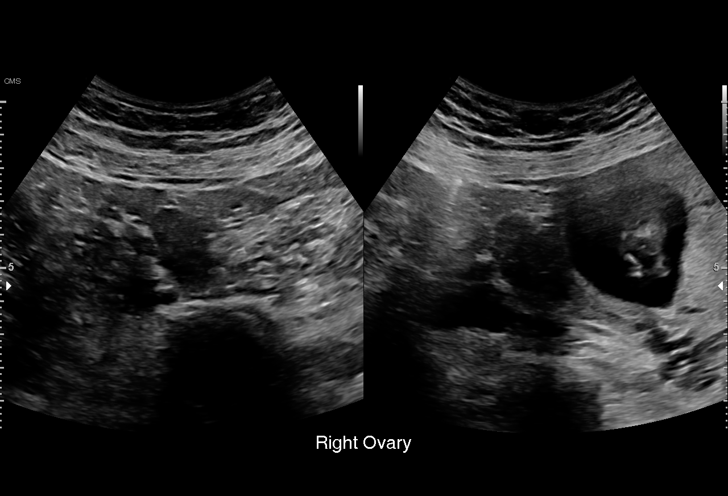
[im 19/24]
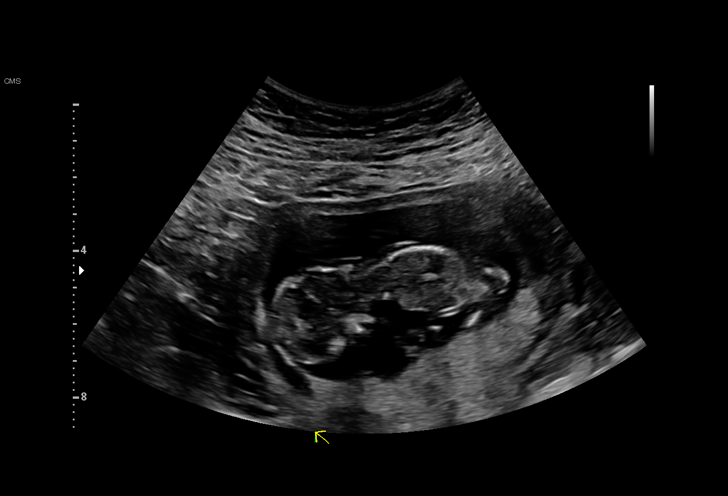
[im 21/24]
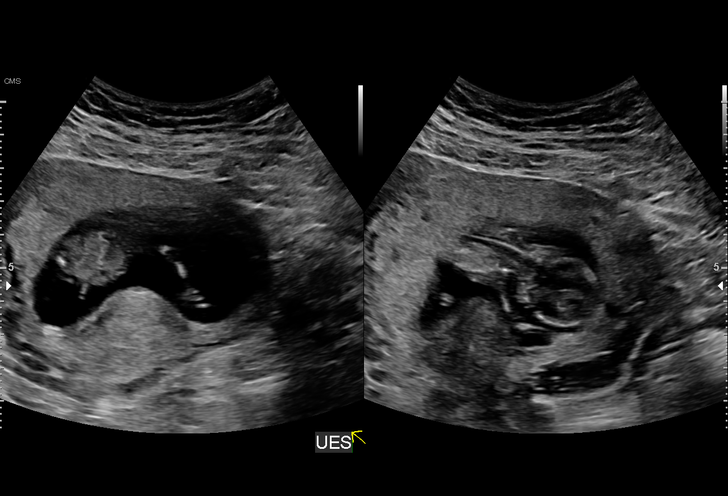
[im 22/24]
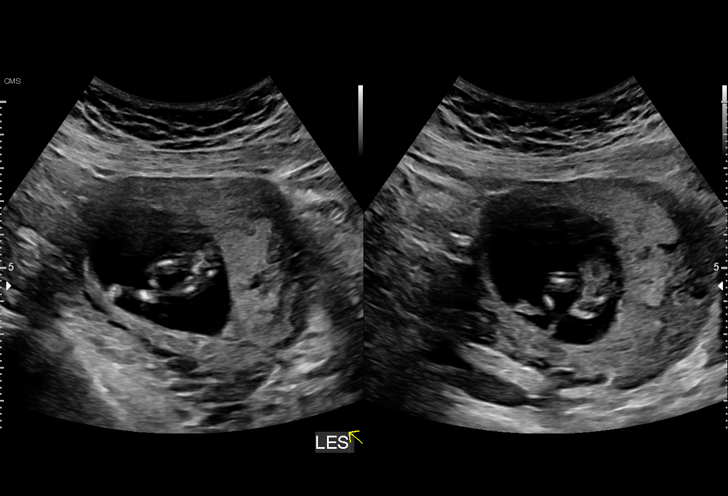
[im 24/24]
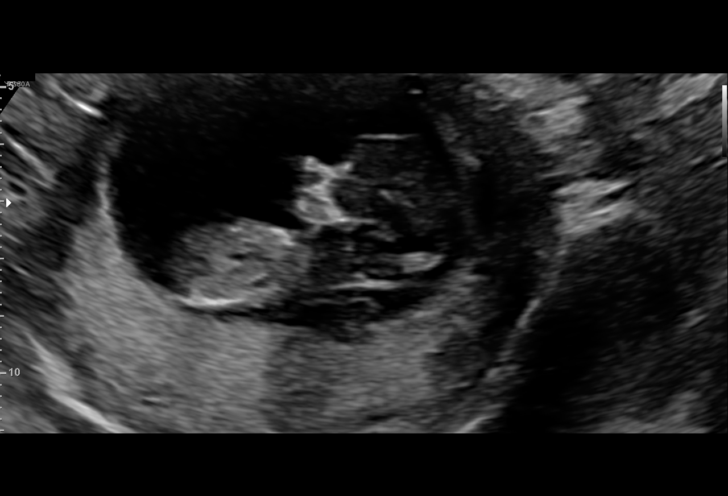

[15 of 24 positions shown; findings below may reference images not displayed]

FINDINGS: Intrauterine gestational sac: Single

Yolk sac:  Not Visualized.

Embryo:  Visualized.

Cardiac Activity: Visualized.

Heart Rate: 159 bpm

CRL:   6.6  mm   12 w 6 d                  US EDC: 07/24/2017

Subchorionic hemorrhage:  None visualized.

Maternal uterus/adnexae:

Subchorionic hemorrhage: None

Right ovary: Normal

Left ovary: Normal

Other :None

Free fluid:  None
IMPRESSION: 1. Single living intrauterine gestation. The estimated gestational
age is 12 weeks and 6 days.
2. No complications identified.

## 2019-12-11 ENCOUNTER — Other Ambulatory Visit: Payer: Self-pay

## 2019-12-11 ENCOUNTER — Encounter (HOSPITAL_COMMUNITY): Payer: Self-pay

## 2019-12-11 ENCOUNTER — Ambulatory Visit (HOSPITAL_COMMUNITY)
Admission: EM | Admit: 2019-12-11 | Discharge: 2019-12-11 | Disposition: A | Discharge status: Home / Self Care | Payer: BC Managed Care – PPO | Attending: Family Medicine | Admitting: Family Medicine

## 2019-12-11 DIAGNOSIS — N939 Abnormal uterine and vaginal bleeding, unspecified: Secondary | ICD-10-CM | POA: Diagnosis present

## 2019-12-11 LAB — HCG, QUANTITATIVE, PREGNANCY: hCG, Beta Chain, Quant, S: <1 m[IU]/mL (ref <5)

## 2019-12-11 MED ORDER — IBUPROFEN 600 MG PO TABS: 600.0000 mg | ORAL_TABLET | Freq: Four times a day (QID) | ORAL | 0 refills | Status: DC

## 2019-12-11 NOTE — ED Triage Notes (Signed)
Patient presents to Urgent Care with complaints of blood clots in her menstrual bleeding since yesterday. Patient reports it was like a normal period at first but then began to see clots. Pt took a Plan B pill last week.  Pt has gone through two pads today.

## 2019-12-11 NOTE — Discharge Instructions (Addendum)
Most likely the early menstrual cycle and increased bleeding is from taking the Plan B. I will call you if your hCG test is positive Take the ibuprofen 600 mg every 6 hours this will help with pain decrease bleeding. If this worsens and you start developing heavier bleeding, dizziness, lightheadedness you need to come back for follow-up.

## 2019-12-12 NOTE — ED Provider Notes (Signed)
[facility]    CSN: [id number] Arrival date & time: 12/11/19  1315      History   Chief Complaint Chief Complaint  Patient presents with  . Vaginal Bleeding    HPI Shelley Simmons is a 24 y.o. female.   Patient is a 24 year old female the presents today with heavy vaginal bleeding and clotting.  Symptoms have been constant for the past 2 days.  Soaking through a pad every couple hours.  Reporting she is approximately 10 days early for her menstrual cycle.  Typically starts around the 12th of every month.  She took the morning after pill approximately 1 week ago.  Has generalized lower abdominal cramping consistent with menstrual cycle.  Denies any dizziness, lightheadedness, shortness of breath or weakness. No fever, chills, dysuria, hematuria. No specific concern for STDs.   ROS per HPI      Past Medical History:  Diagnosis Date  . Medical history non-contributory     Patient Active Problem List   Diagnosis Date Noted  . Indication for care in labor or delivery 07/29/2017  . Normal spontaneous vaginal delivery 07/29/2017    Past Surgical History:  Procedure Laterality Date  . NO PAST SURGERIES      OB History    Gravida  3   Para  2   Term  2   Preterm      AB  1   Living  2     SAB      TAB  1   Ectopic      Multiple  0   Live Births  2            Home Medications    Prior to Admission medications   Medication Sig Start Date End Date Taking? Authorizing Provider  acetaminophen (TYLENOL) 325 MG tablet Take 2 tablets (650 mg total) by mouth every 4 (four) hours as needed (for pain scale < 4). 07/31/17   Burnard Leigh, MD  ibuprofen (ADVIL) 600 MG tablet Take 1 tablet (600 mg total) by mouth every 6 (six) hours. 12/11/19   Janace Aris, NP  Prenatal Vit-Fe Fumarate-FA (PRENATAL MULTIVITAMIN) TABS tablet Take 1 tablet by mouth daily at 12 noon.    [provider]    Family History Family History  Problem Relation  Age of Onset  . Healthy Mother   . Healthy Father     Social History Social History   Tobacco Use  . Smoking status: Never Smoker  . Smokeless tobacco: Never Used  Substance Use Topics  . Alcohol use: No  . Drug use: No     Allergies   Shrimp [shellfish allergy]   Review of Systems Review of Systems   Physical Exam Triage Vital Signs ED Triage Vitals  Enc Vitals Group     BP 12/11/19 1352 121/78     Pulse Rate 12/11/19 1352 90     Resp 12/11/19 1352 16     Temp 12/11/19 1352 98.4 F (36.9 C)     Temp Source 12/11/19 1352 Oral     SpO2 12/11/19 1352 100 %     Weight --      Height --      Head Circumference --      Peak Flow --      Pain Score 12/11/19 1356 0     Pain Loc --      Pain Edu? --      Excl. in GC? --  No data found.  Updated Vital Signs BP 121/78 (BP Location: Left Arm)   Pulse 90   Temp 98.4 F (36.9 C) (Oral)   Resp 16   SpO2 100%   Visual Acuity Right Eye Distance:   Left Eye Distance:   Bilateral Distance:    Right Eye Near:   Left Eye Near:    Bilateral Near:     Physical Exam Vitals and nursing note reviewed.  Constitutional:      General: She is not in acute distress.    Appearance: Normal appearance. She is not ill-appearing, toxic-appearing or diaphoretic.  HENT:     Head: Normocephalic.     Nose: Nose normal.  Eyes:     Conjunctiva/sclera: Conjunctivae normal.  Pulmonary:     Effort: Pulmonary effort is normal.  Musculoskeletal:        General: Normal range of motion.     Cervical back: Normal range of motion.  Skin:    General: Skin is warm and dry.     Findings: No rash.  Neurological:     Mental Status: She is alert.  Psychiatric:        Mood and Affect: Mood normal.      UC Treatments / Results  Labs (all labs ordered are listed, but only abnormal results are displayed) Labs Reviewed  HCG, QUANTITATIVE, PREGNANCY    EKG   Radiology No results found.  Procedures Procedures (including  critical care time)  Medications Ordered in UC Medications - No data to display  Initial Impression / Assessment and Plan / UC Course  I have reviewed the triage vital signs and the nursing notes.  Pertinent labs & imaging results that were available during my care of the patient were reviewed by me and considered in my medical decision making (see chart for details).     Abnormal vaginal bleeding-most likely this is due to taking the Plan B hCG here negative Recommended 600 mg of ibuprofen every 6 hours to help with menstrual cramping and decrease bleeding. If the bleeding continues or worsen she will need to follow-up Gave her contact for OB/GYN follow-up Final Clinical Impressions(s) / UC Diagnoses   Final diagnoses:  Abnormal vaginal bleeding     Discharge Instructions     Most likely the early menstrual cycle and increased bleeding is from taking the Plan B. I will call you if your hCG test is positive Take the ibuprofen 600 mg every 6 hours this will help with pain decrease bleeding. If this worsens and you start developing heavier bleeding, dizziness, lightheadedness you need to come back for follow-up.    ED Prescriptions    Medication Sig Dispense Auth. Provider   ibuprofen (ADVIL) 600 MG tablet Take 1 tablet (600 mg total) by mouth every 6 (six) hours. 30 tablet Loura Halt A, NP     PDMP not reviewed this encounter.   Loura Halt A, NP 12/12/19 1443

## 2020-11-09 NOTE — L&D Delivery Note (Signed)
[facility] Delivery Note  Shelley Simmons is a 25 y.o. [id number] s/p SVD at [redacted]w[redacted]d. She was admitted for SROM at 0200 on 12/19.   ROM: 15h 20m with clear fluid GBS Status: Negative Maximum Maternal Temperature: 98.3 degrees Fahrenheit  Labor Progress: Patient presented with SROM, clear fluid, and was progressing normally. Around 7.5 cm dilation she had protracted dilation and pitocin was started for augmentation.  Delivery Date/Time: SVD 10/27/21 at 1728 Delivery: Called to room and patient was complete and pushing. Head delivered ROA. Loose nuchal present, reduced prior to delivery. Shoulder and body delivered in usual fashion. Infant with spontaneous cry, placed on mother's abdomen, dried and stimulated. Cord clamped x 2 after 1-minute delay, and cut by father of baby under my direct supervision. Cord blood drawn. Placenta delivered spontaneously with gentle cord traction. Fundus firm with massage and Pitocin. Labia, perineum, vagina, and cervix were inspected, no tears appreciated.   Placenta: complete, three vessel cord appreciated Complications: None Lacerations: right labial abrasion but no tear EBL: 50 mL Analgesia: epidural  Postpartum Planning [x]  message to sent to schedule follow-up   Infant: viable female infant   APGARs 8,9 at 1 and 5 minutes respectively   weight pending  , MD [facility], [facility]

## 2021-01-04 ENCOUNTER — Other Ambulatory Visit: Payer: Self-pay

## 2021-01-04 ENCOUNTER — Encounter (HOSPITAL_COMMUNITY): Payer: Self-pay

## 2021-01-04 ENCOUNTER — Ambulatory Visit (HOSPITAL_COMMUNITY)
Admission: EM | Admit: 2021-01-04 | Discharge: 2021-01-04 | Disposition: A | Discharge status: Home / Self Care | Payer: BC Managed Care – PPO

## 2021-01-04 DIAGNOSIS — R202 Paresthesia of skin: Secondary | ICD-10-CM

## 2021-01-04 DIAGNOSIS — R200 Anesthesia of skin: Secondary | ICD-10-CM

## 2021-01-04 DIAGNOSIS — R079 Chest pain, unspecified: Secondary | ICD-10-CM

## 2021-01-04 MED ORDER — NAPROXEN 500 MG PO TABS: 500.0000 mg | ORAL_TABLET | Freq: Two times a day (BID) | ORAL | 0 refills | Status: DC | PRN

## 2021-01-04 NOTE — ED Triage Notes (Signed)
Pt reports left sided chest pain numbness, tingling and cold sensation in the left arm since last night. States she is having episodes of left sided chest pain x 6 months. Denies abdominal pina, diarrhea, vision changes, headaches, dizziness, nausea.

## 2021-01-04 NOTE — ED Provider Notes (Signed)
[facility]    CSN: [id number] Arrival date & time: 01/04/21  1002      History   Chief Complaint Chief Complaint  Patient presents with  . Chest Pain    HPI Shelley Simmons is a 25 y.o. female.   Presenting today with an episode of about an hour of mid-left sided chest discomfort that then moved to right chest when she woke up this morning. This resolved spontaneously per patient and was not associated with diaphoresis, N/V, SOB, dizziness, syncope, headaches.  She states this also happened to her about a year ago now and at this time is also self-limited.  She also noticed some left hand numbness and tingling this morning and states the palm of her hand was cool to the touch.  She is concerned about circulation issues at this time.  Denies discoloration to the hand, injuries to the neck shoulder or elbow, weakness, swelling.  Has not tried anything over-the-counter for the symptoms.     Past Medical History:  Diagnosis Date  . Medical history non-contributory     Patient Active Problem List   Diagnosis Date Noted  . Indication for care in labor or delivery 07/29/2017  . Normal spontaneous vaginal delivery 07/29/2017    Past Surgical History:  Procedure Laterality Date  . NO PAST SURGERIES      OB History    Gravida  3   Para  2   Term  2   Preterm      AB  1   Living  2     SAB      IAB  1   Ectopic      Multiple  0   Live Births  2            Home Medications    Prior to Admission medications   Medication Sig Start Date End Date Taking? Authorizing Provider  naproxen (NAPROSYN) 500 MG tablet Take 1 tablet (500 mg total) by mouth 2 (two) times daily as needed. 01/04/21  Yes Particia Nearing, PA-C  Probiotic Product (SOLUBLE FIBER/PROBIOTICS PO) Take by mouth.   Yes [provider]  acetaminophen (TYLENOL) 325 MG tablet Take 2 tablets (650 mg total) by mouth every 4 (four) hours as needed (for pain scale <  4). 07/31/17   Burnard Leigh, MD  ibuprofen (ADVIL) 600 MG tablet Take 1 tablet (600 mg total) by mouth every 6 (six) hours. 12/11/19   Janace Aris, NP  Prenatal Vit-Fe Fumarate-FA (PRENATAL MULTIVITAMIN) TABS tablet Take 1 tablet by mouth daily at 12 noon.    [provider]    Family History Family History  Problem Relation Age of Onset  . Healthy Mother   . Healthy Father     Social History Social History   Tobacco Use  . Smoking status: Never Smoker  . Smokeless tobacco: Never Used  Vaping Use  . Vaping Use: Never used  Substance Use Topics  . Alcohol use: No  . Drug use: No     Allergies   Shrimp [shellfish allergy]   Review of Systems Review of Systems Per HPI Physical Exam Triage Vital Signs ED Triage Vitals  Enc Vitals Group     BP 01/04/21 1022 (!) 109/59     Pulse Rate 01/04/21 1022 75     Resp 01/04/21 1022 18     Temp 01/04/21 1022 98.3 F (36.8 C)     Temp Source 01/04/21 1022 Oral  SpO2 01/04/21 1022 98 %     Weight --      Height --      Head Circumference --      Peak Flow --      Pain Score 01/04/21 1020 3     Pain Loc --      Pain Edu? --      Excl. in GC? --    No data found.  Updated Vital Signs BP (!) 109/59 (BP Location: Right Arm)   Pulse 75   Temp 98.3 F (36.8 C) (Oral)   Resp 18   LMP  (Within Weeks) Comment: 3 weeks  SpO2 98%   Visual Acuity Right Eye Distance:   Left Eye Distance:   Bilateral Distance:    Right Eye Near:   Left Eye Near:    Bilateral Near:     Physical Exam Vitals and nursing note reviewed.  Constitutional:      Appearance: Normal appearance. She is not ill-appearing.  HENT:     Head: Atraumatic.     Mouth/Throat:     Mouth: Mucous membranes are moist.     Pharynx: Oropharynx is clear.  Eyes:     Extraocular Movements: Extraocular movements intact.     Conjunctiva/sclera: Conjunctivae normal.  Cardiovascular:     Rate and Rhythm: Normal rate and regular rhythm.     Pulses:  Normal pulses.     Heart sounds: Normal heart sounds.     Comments: Good capillary refill bilateral hands Pulmonary:     Effort: Pulmonary effort is normal. No respiratory distress.     Breath sounds: Normal breath sounds. No wheezing or rales.  Abdominal:     General: Bowel sounds are normal. There is no distension.     Palpations: Abdomen is soft.     Tenderness: There is no abdominal tenderness. There is no guarding.  Musculoskeletal:        General: No swelling or tenderness. Normal range of motion.     Cervical back: Normal range of motion and neck supple.  Skin:    General: Skin is warm and dry.     Findings: No erythema.  Neurological:     Mental Status: She is alert and oriented to person, place, and time.     Sensory: No sensory deficit.     Motor: No weakness.     Gait: Gait normal.     Deep Tendon Reflexes: Reflexes normal.  Psychiatric:        Mood and Affect: Mood normal.        Thought Content: Thought content normal.        Judgment: Judgment normal.      UC Treatments / Results  Labs (all labs ordered are listed, but only abnormal results are displayed) Labs Reviewed - No data to display  EKG   Radiology No results found.  Procedures Procedures (including critical care time)  Medications Ordered in UC Medications - No data to display  Initial Impression / Assessment and Plan / UC Course  I have reviewed the triage vital signs and the nursing notes.  Pertinent labs & imaging results that were available during my care of the patient were reviewed by me and considered in my medical decision making (see chart for details).     Vital signs and exam reassuring today, EKG normal sinus rhythm at 87 bpm with no acute ST or T wave changes.  Discussed possible causes of patient's chest pain including costochondritis, GERD/esophageal spasm,  anxiety, etc.  Did discuss with her limitations in this environment to rule out cardiac cause of her chest pain.  She is  understanding of this and declines going to the ED for further evaluation today.  Do suspect that her tingling to left hand is likely caused by nerve irritation to the ulnar nerve based on the location of her perceived symptoms.  Naproxen as needed for this stretches to the arm shoulder and neck muscles, and close PCP follow-up for recheck of all the symptoms.  Return precautions given.  Final Clinical Impressions(s) / UC Diagnoses   Final diagnoses:  Nonspecific chest pain  Numbness and tingling in left hand   Discharge Instructions   None    ED Prescriptions    Medication Sig Dispense Auth. Provider   naproxen (NAPROSYN) 500 MG tablet Take 1 tablet (500 mg total) by mouth 2 (two) times daily as needed. 30 tablet Particia Nearing, [place]     PDMP not reviewed this encounter.   Particia Nearing, [place] 01/04/21 1115

## 2021-05-13 LAB — OB RESULTS CONSOLE RUBELLA ANTIBODY, IGM
Rubella: IMMUNE
Rubella: IMMUNE

## 2021-05-13 LAB — OB RESULTS CONSOLE HEPATITIS B SURFACE ANTIGEN
Hepatitis B Surface Ag: NEGATIVE
Hepatitis B Surface Ag: NEGATIVE

## 2021-05-13 LAB — OB RESULTS CONSOLE RPR: RPR: NONREACTIVE

## 2021-05-13 LAB — HEPATITIS C ANTIBODY: HCV Ab: NEGATIVE

## 2021-05-13 LAB — OB RESULTS CONSOLE HIV ANTIBODY (ROUTINE TESTING): HIV: NONREACTIVE

## 2021-05-13 LAB — OB RESULTS CONSOLE GC/CHLAMYDIA
Chlamydia: NEGATIVE
Gonorrhea: NEGATIVE

## 2021-08-11 LAB — OB RESULTS CONSOLE HIV ANTIBODY (ROUTINE TESTING): HIV: NONREACTIVE

## 2021-08-11 LAB — OB RESULTS CONSOLE RPR: RPR: NONREACTIVE

## 2021-10-06 LAB — OB RESULTS CONSOLE GC/CHLAMYDIA
Chlamydia: NEGATIVE
Gonorrhea: NEGATIVE

## 2021-10-06 LAB — OB RESULTS CONSOLE GBS: GBS: NEGATIVE

## 2021-10-27 ENCOUNTER — Inpatient Hospital Stay (HOSPITAL_COMMUNITY): Payer: Medicaid Other | Admitting: Anesthesiology

## 2021-10-27 ENCOUNTER — Encounter (HOSPITAL_COMMUNITY): Payer: Self-pay | Admitting: Obstetrics and Gynecology

## 2021-10-27 ENCOUNTER — Inpatient Hospital Stay (HOSPITAL_COMMUNITY)
Admission: AD | Admit: 2021-10-27 | Discharge: 2021-10-29 | DRG: 807 | Disposition: A | Discharge status: Home / Self Care | Payer: Medicaid Other | Attending: Obstetrics & Gynecology | Admitting: Obstetrics & Gynecology

## 2021-10-27 ENCOUNTER — Other Ambulatory Visit: Payer: Self-pay

## 2021-10-27 DIAGNOSIS — Z20822 Contact with and (suspected) exposure to covid-19: Secondary | ICD-10-CM | POA: Diagnosis present

## 2021-10-27 DIAGNOSIS — O26893 Other specified pregnancy related conditions, third trimester: Secondary | ICD-10-CM | POA: Diagnosis present

## 2021-10-27 DIAGNOSIS — O4202 Full-term premature rupture of membranes, onset of labor within 24 hours of rupture: Secondary | ICD-10-CM

## 2021-10-27 DIAGNOSIS — Z3A39 39 weeks gestation of pregnancy: Secondary | ICD-10-CM | POA: Diagnosis not present

## 2021-10-27 DIAGNOSIS — O99214 Obesity complicating childbirth: Secondary | ICD-10-CM | POA: Diagnosis present

## 2021-10-27 DIAGNOSIS — Z3480 Encounter for supervision of other normal pregnancy, unspecified trimester: Secondary | ICD-10-CM

## 2021-10-27 LAB — RESP PANEL BY RT-PCR (FLU A&B, COVID) ARPGX2
Influenza A by PCR: NEGATIVE
Influenza B by PCR: NEGATIVE
SARS Coronavirus 2 by RT PCR: NEGATIVE

## 2021-10-27 LAB — TYPE AND SCREEN
ABO/RH(D): O POS
Antibody Screen: NEGATIVE

## 2021-10-27 LAB — CBC
HCT: 36.2 % (ref 36.0–46.0)
Hemoglobin: 12.3 g/dL (ref 12.0–15.0)
MCH: 30.4 pg (ref 26.0–34.0)
MCHC: 34 g/dL (ref 30.0–36.0)
MCV: 89.4 fL (ref 80.0–100.0)
Platelets: 379 10*3/uL (ref 150–400)
RBC: 4.05 MIL/uL (ref 3.87–5.11)
RDW: 13.3 % (ref 11.5–15.5)
WBC: 11.6 10*3/uL — ABNORMAL HIGH (ref 4.0–10.5)
nRBC: 0 % (ref 0.0–0.2)

## 2021-10-27 LAB — POCT FERN TEST: POCT Fern Test: POSITIVE

## 2021-10-27 LAB — RPR: RPR Ser Ql: NONREACTIVE

## 2021-10-27 MED ORDER — ONDANSETRON HCL 4 MG/2ML IJ SOLN: 4.0000 mg | Freq: Four times a day (QID) | INTRAMUSCULAR | Status: DC | PRN

## 2021-10-27 MED ORDER — TERBUTALINE SULFATE 1 MG/ML IJ SOLN: 0.2500 mg | Freq: Once | INTRAMUSCULAR | Status: DC | PRN

## 2021-10-27 MED ORDER — OXYTOCIN-SODIUM CHLORIDE 30-0.9 UT/500ML-% IV SOLN
1.0000 m[IU]/min | INTRAVENOUS | Status: DC
  Administered 2021-10-27: 14:00:00 2 m[IU]/min via INTRAVENOUS
  Filled 2021-10-27: qty 500

## 2021-10-27 MED ORDER — OXYCODONE-ACETAMINOPHEN 5-325 MG PO TABS: 2.0000 | ORAL_TABLET | ORAL | Status: DC | PRN

## 2021-10-27 MED ORDER — COCONUT OIL OIL: 1.0000 "application " | TOPICAL_OIL | Status: DC | PRN

## 2021-10-27 MED ORDER — PHENYLEPHRINE 40 MCG/ML (10ML) SYRINGE FOR IV PUSH (FOR BLOOD PRESSURE SUPPORT): 80.0000 ug | PREFILLED_SYRINGE | INTRAVENOUS | Status: DC | PRN

## 2021-10-27 MED ORDER — EPHEDRINE 5 MG/ML INJ: 10.0000 mg | INTRAVENOUS | Status: DC | PRN

## 2021-10-27 MED ORDER — LACTATED RINGERS IV SOLN: 500.0000 mL | Freq: Once | INTRAVENOUS | Status: DC

## 2021-10-27 MED ORDER — FENTANYL CITRATE (PF) 100 MCG/2ML IJ SOLN
100.0000 ug | Freq: Once | INTRAMUSCULAR | Status: AC
  Administered 2021-10-27: 09:00:00 100 ug via INTRAVENOUS
  Filled 2021-10-27: qty 2

## 2021-10-27 MED ORDER — ACETAMINOPHEN 325 MG PO TABS: 650.0000 mg | ORAL_TABLET | ORAL | Status: DC | PRN

## 2021-10-27 MED ORDER — IBUPROFEN 600 MG PO TABS
600.0000 mg | ORAL_TABLET | Freq: Four times a day (QID) | ORAL | Status: DC
  Administered 2021-10-27 – 2021-10-29 (×7): 600 mg via ORAL
  Filled 2021-10-27 (×7): qty 1

## 2021-10-27 MED ORDER — OXYTOCIN BOLUS FROM INFUSION
333.0000 mL | Freq: Once | INTRAVENOUS | Status: AC
  Administered 2021-10-27: 18:00:00 333 mL via INTRAVENOUS

## 2021-10-27 MED ORDER — OXYCODONE-ACETAMINOPHEN 5-325 MG PO TABS: 1.0000 | ORAL_TABLET | ORAL | Status: DC | PRN

## 2021-10-27 MED ORDER — WITCH HAZEL-GLYCERIN EX PADS: 1.0000 "application " | MEDICATED_PAD | CUTANEOUS | Status: DC | PRN

## 2021-10-27 MED ORDER — SOD CITRATE-CITRIC ACID 500-334 MG/5ML PO SOLN: 30.0000 mL | ORAL | Status: DC | PRN

## 2021-10-27 MED ORDER — TETANUS-DIPHTH-ACELL PERTUSSIS 5-2.5-18.5 LF-MCG/0.5 IM SUSY: 0.5000 mL | PREFILLED_SYRINGE | Freq: Once | INTRAMUSCULAR | Status: DC

## 2021-10-27 MED ORDER — ONDANSETRON HCL 4 MG PO TABS: 4.0000 mg | ORAL_TABLET | ORAL | Status: DC | PRN

## 2021-10-27 MED ORDER — SIMETHICONE 80 MG PO CHEW: 80.0000 mg | CHEWABLE_TABLET | ORAL | Status: DC | PRN

## 2021-10-27 MED ORDER — DIPHENHYDRAMINE HCL 25 MG PO CAPS: 25.0000 mg | ORAL_CAPSULE | Freq: Four times a day (QID) | ORAL | Status: DC | PRN

## 2021-10-27 MED ORDER — OXYTOCIN-SODIUM CHLORIDE 30-0.9 UT/500ML-% IV SOLN: 2.5000 [IU]/h | INTRAVENOUS | Status: DC

## 2021-10-27 MED ORDER — DIBUCAINE (PERIANAL) 1 % EX OINT: 1.0000 "application " | TOPICAL_OINTMENT | CUTANEOUS | Status: DC | PRN

## 2021-10-27 MED ORDER — LACTATED RINGERS IV SOLN: 500.0000 mL | INTRAVENOUS | Status: DC | PRN

## 2021-10-27 MED ORDER — MEDROXYPROGESTERONE ACETATE 150 MG/ML IM SUSP: 150.0000 mg | INTRAMUSCULAR | Status: DC | PRN

## 2021-10-27 MED ORDER — FENTANYL-BUPIVACAINE-NACL 0.5-0.125-0.9 MG/250ML-% EP SOLN
12.0000 mL/h | EPIDURAL | Status: DC | PRN
  Administered 2021-10-27: 10:00:00 12 mL/h via EPIDURAL
  Filled 2021-10-27: qty 250

## 2021-10-27 MED ORDER — LACTATED RINGERS IV SOLN: INTRAVENOUS | Status: DC

## 2021-10-27 MED ORDER — SENNOSIDES-DOCUSATE SODIUM 8.6-50 MG PO TABS
2.0000 | ORAL_TABLET | Freq: Every day | ORAL | Status: DC
  Administered 2021-10-28: 11:00:00 2 via ORAL
  Filled 2021-10-27 (×2): qty 2

## 2021-10-27 MED ORDER — PRENATAL MULTIVITAMIN CH
1.0000 | ORAL_TABLET | Freq: Every day | ORAL | Status: DC
  Administered 2021-10-28 – 2021-10-29 (×2): 1 via ORAL
  Filled 2021-10-27 (×2): qty 1

## 2021-10-27 MED ORDER — ONDANSETRON HCL 4 MG/2ML IJ SOLN: 4.0000 mg | INTRAMUSCULAR | Status: DC | PRN

## 2021-10-27 MED ORDER — DIPHENHYDRAMINE HCL 50 MG/ML IJ SOLN: 12.5000 mg | INTRAMUSCULAR | Status: DC | PRN

## 2021-10-27 MED ORDER — LIDOCAINE HCL (PF) 1 % IJ SOLN: 30.0000 mL | INTRAMUSCULAR | Status: DC | PRN

## 2021-10-27 MED ORDER — LIDOCAINE-EPINEPHRINE (PF) 2 %-1:200000 IJ SOLN
INTRAMUSCULAR | Status: DC | PRN
  Administered 2021-10-27: 5 mL via EPIDURAL

## 2021-10-27 MED ORDER — BENZOCAINE-MENTHOL 20-0.5 % EX AERO: 1.0000 "application " | INHALATION_SPRAY | CUTANEOUS | Status: DC | PRN

## 2021-10-27 NOTE — H&P (Signed)
OBSTETRIC ADMISSION HISTORY AND PHYSICAL  Shelley Simmons is a 25 y.o. female [phone] with IUP at [redacted]w[redacted]d by certain LMP presenting for SROM @0200  this morning and contractions.   Reports fetal movement. Denies vaginal bleeding.  She received her prenatal care at [facility].  Support person in labor: Stephan (FOB)  Ultrasounds Anatomy U/S: Normal with posterior placenta   EFW 71.7% @ 22.6 wks  Prenatal History/Complications: Obesity  Past Medical History: Past Medical History:  Diagnosis Date   Medical history non-contributory     Past Surgical History: Past Surgical History:  Procedure Laterality Date   NO PAST SURGERIES      Obstetrical History: OB History     Gravida  4   Para  2   Term  2   Preterm      AB  1   Living  2      SAB      IAB  1   Ectopic      Multiple  0   Live Births  2           Social History: Social History   Socioeconomic History   Marital status: Single    Spouse name: Not on file   Number of children: Not on file   Years of education: Not on file   Highest education level: Not on file  Occupational History   Not on file  Tobacco Use   Smoking status: Never   Smokeless tobacco: Never  Vaping Use   Vaping Use: Never used  Substance and Sexual Activity   Alcohol use: No   Drug use: No   Sexual activity: Yes  Other Topics Concern   Not on file  Social History Narrative   Not on file   Social Determinants of Health   Financial Resource Strain: Not on file  Food Insecurity: Not on file  Transportation Needs: Not on file  Physical Activity: Not on file  Stress: Not on file  Social Connections: Not on file    Family History: Family History  Problem Relation Age of Onset   Healthy Mother    Healthy Father     Allergies: Allergies  Allergen Reactions   Shrimp [Shellfish Allergy] Anaphylaxis    Medications Prior to Admission  Medication Sig Dispense Refill Last Dose   Prenatal Vit-Fe Fumarate-FA  (PRENATAL MULTIVITAMIN) TABS tablet Take 1 tablet by mouth daily at 12 noon.   Past Week   acetaminophen (TYLENOL) 325 MG tablet Take 2 tablets (650 mg total) by mouth every 4 (four) hours as needed (for pain scale < 4). 30 tablet 0    ibuprofen (ADVIL) 600 MG tablet Take 1 tablet (600 mg total) by mouth every 6 (six) hours. 30 tablet 0    naproxen (NAPROSYN) 500 MG tablet Take 1 tablet (500 mg total) by mouth 2 (two) times daily as needed. 30 tablet 0    Probiotic Product (SOLUBLE FIBER/PROBIOTICS PO) Take by mouth.        Review of Systems  All systems reviewed and negative except as stated in HPI  Blood pressure 122/67, pulse 83, temperature 98.3 F (36.8 C), temperature source Oral, resp. rate 18, height 5\' 2"  (1.575 m), weight 98.9 kg, SpO2 100 %, unknown if currently breastfeeding. General appearance: alert, cooperative, fatigued, no distress, and moderately obese Lungs: no respiratory distress Heart: regular rate  Abdomen: soft, non-tender; gravid Pelvic: adequate Extremities: Homans sign is negative, no sign of DVT Presentation: cephalic Fetal monitoring: 140 bpm /  moderate variability / accels present / decels absent  Uterine activity: regular every 2-3 mins  Dilation: 7.5 Effacement (%): 90 Station: -2 Exam by:: Johnathan Hausen RN  Prenatal labs: ABO, Rh: --/--/O POS (12/19 1751) Antibody: NEG (12/19 0711) Rubella: Immune, Immune (07/05 0000) RPR: Nonreactive (10/03 0000)  HBsAg: Negative, Negative (07/05 0000)  HIV: Non-reactive (10/03 0000)  GBS: Negative/-- (11/28 0000)  Glucola: Normal 1 hr Genetic screening:  Negative  Prenatal Transfer Tool  Maternal Diabetes: No Genetic Screening: Normal Maternal Ultrasounds/Referrals: Normal Fetal Ultrasounds or other Referrals:  None Maternal Substance Abuse:  No Significant Maternal Medications:  None Significant Maternal Lab Results: Group B Strep negative  Results for orders placed or performed during the hospital  encounter of 10/27/21 (from the past 24 hour(s))  Resp Panel by RT-PCR (Flu A&B, Covid) Nasopharyngeal Swab   Collection Time: 10/27/21  7:00 AM   Specimen: Nasopharyngeal Swab; Nasopharyngeal(NP) swabs in vial transport medium  Result Value Ref Range   SARS Coronavirus 2 by RT PCR NEGATIVE NEGATIVE   Influenza A by PCR NEGATIVE NEGATIVE   Influenza B by PCR NEGATIVE NEGATIVE  CBC   Collection Time: 10/27/21  7:11 AM  Result Value Ref Range   WBC 11.6 (H) 4.0 - 10.5 K/uL   RBC 4.05 3.87 - 5.11 MIL/uL   Hemoglobin 12.3 12.0 - 15.0 g/dL   HCT 02.5 85.2 - 77.8 %   MCV 89.4 80.0 - 100.0 fL   MCH 30.4 26.0 - 34.0 pg   MCHC 34.0 30.0 - 36.0 g/dL   RDW 24.2 35.3 - 61.4 %   Platelets 379 150 - 400 K/uL   nRBC 0.0 0.0 - 0.2 %  Type and screen [facility]   Collection Time: 10/27/21  7:11 AM  Result Value Ref Range   ABO/RH(D) O POS    Antibody Screen NEG    Sample Expiration      10/30/2021,2359 Performed at [facility], [address], [place], [place] [address]   Crist Fat Test   Collection Time: 10/27/21  7:32 AM  Result Value Ref Range   POCT Fern Test Positive = ruptured amniotic membanes     Patient Active Problem List   Diagnosis Date Noted   Indication for care in labor and delivery, antepartum 10/27/2021   Indication for care in labor or delivery 07/29/2017   Normal spontaneous vaginal delivery 07/29/2017    Assessment/Plan:  Shelley Simmons is a 25 y.o. [id number] at [redacted]w[redacted]d here for SROM @ 0200 this morning.  Labor: active -- pain control: epidural  Fetal Wellbeing: EFW 7.5 lbs by Leopold's. Cephalic by SVE.  -- GBS (Neg) -- continuous fetal monitoring - Category 1   Postpartum Planning -- breast/bottle -- OCP at [facility] visit   Raelyn Mora, CNM  10/27/2021, 8:51 AM

## 2021-10-27 NOTE — Anesthesia Procedure Notes (Signed)
Epidural Patient location during procedure: OB Start time: 10/27/2021 10:00 AM End time: 10/27/2021 10:10 AM  Staffing Anesthesiologist: Elmer Picker, MD Performed: anesthesiologist   Preanesthetic Checklist Completed: patient identified, IV checked, risks and benefits discussed, monitors and equipment checked, pre-op evaluation and timeout performed  Epidural Patient position: sitting Prep: DuraPrep and site prepped and draped Patient monitoring: continuous pulse ox, blood pressure, heart rate and cardiac monitor Approach: midline Location: L3-L4 Injection technique: LOR air  Needle:  Needle type: Tuohy  Needle gauge: 17 G Needle length: 9 cm Needle insertion depth: 5.5 cm Catheter type: closed end flexible Catheter size: 19 Gauge Catheter at skin depth: 11 cm Test dose: negative  Assessment Sensory level: T8 Events: blood not aspirated, injection not painful, no injection resistance, no paresthesia and negative IV test  Additional Notes Patient identified. Risks/Benefits/Options discussed with patient including but not limited to bleeding, infection, nerve damage, paralysis, failed block, incomplete pain control, headache, blood pressure changes, nausea, vomiting, reactions to medication both or allergic, itching and postpartum back pain. Confirmed with bedside nurse the patient's most recent platelet count. Confirmed with patient that they are not currently taking any anticoagulation, have any bleeding history or any family history of bleeding disorders. Patient expressed understanding and wished to proceed. All questions were answered. Sterile technique was used throughout the entire procedure. Please see nursing notes for vital signs. Test dose was given through epidural catheter and negative prior to continuing to dose epidural or start infusion. Warning signs of high block given to the patient including shortness of breath, tingling/numbness in hands, complete motor  block, or any concerning symptoms with instructions to call for help. Patient was given instructions on fall risk and not to get out of bed. All questions and concerns addressed with instructions to call with any issues or inadequate analgesia.  Reason for block:procedure for pain

## 2021-10-27 NOTE — Anesthesia Preprocedure Evaluation (Signed)
Anesthesia Evaluation  Patient identified by MRN, date of birth, ID band Patient awake    Reviewed: Allergy & Precautions, NPO status , Patient's Chart, lab work & pertinent test results  Airway Mallampati: III  TM Distance: >3 FB Neck ROM: Full    Dental no notable dental hx.    Pulmonary neg pulmonary ROS,    Pulmonary exam normal breath sounds clear to auscultation       Cardiovascular negative cardio ROS Normal cardiovascular exam Rhythm:Regular Rate:Normal     Neuro/Psych negative neurological ROS  negative psych ROS   GI/Hepatic negative GI ROS, Neg liver ROS,   Endo/Other  Morbid obesity (BMI 40)  Renal/GU negative Renal ROS  negative genitourinary   Musculoskeletal negative musculoskeletal ROS (+)   Abdominal   Peds  Hematology negative hematology ROS (+)   Anesthesia Other Findings Presented in labor  Reproductive/Obstetrics                             Anesthesia Physical Anesthesia Plan  ASA: 3  Anesthesia Plan: Epidural   Post-op Pain Management:    Induction:   PONV Risk Score and Plan: Treatment may vary due to age or medical condition  Airway Management Planned: Natural Airway  Additional Equipment:   Intra-op Plan:   Post-operative Plan:   Informed Consent: I have reviewed the patients History and Physical, chart, labs and discussed the procedure including the risks, benefits and alternatives for the proposed anesthesia with the patient or authorized representative who has indicated his/her understanding and acceptance.       Plan Discussed with: Anesthesiologist  Anesthesia Plan Comments: (Patient identified. Risks, benefits, options discussed with patient including but not limited to bleeding, infection, nerve damage, paralysis, failed block, incomplete pain control, headache, blood pressure changes, nausea, vomiting, reactions to medication, itching,  and post partum back pain. Confirmed with bedside nurse the patient's most recent platelet count. Confirmed with the patient that they are not taking any anticoagulation, have any bleeding history or any family history of bleeding disorders. Patient expressed understanding and wishes to proceed. All questions were answered. )        Anesthesia Quick Evaluation

## 2021-10-27 NOTE — Lactation Note (Signed)
This note was copied from a baby's chart. Lactation Consultation Note  Patient Name: Shelley Simmons [id number] Date: 10/27/2021 Reason for consult: L&D Initial assessment;Term Age:27 hours  LC assisted with first latch in L&D.  Baby already latched, but baby's mouth noted to be shallow and Mom feeling some pinching.  Offered to help baby latch on deeper.  Baby latched on with a deeper latch without any difficulty.  Encouraged STS and offering the breast with cues.    Mom to ask for help once on MBU prn.  Maternal Data Has patient been taught Hand Expression?: No Does the patient have breastfeeding experience prior to this delivery?: Yes How long did the patient breastfeed?: 1 year with 2 other babies  Feeding Mother's Current Feeding Choice: Breast Milk  LATCH Score Latch: Grasps breast easily, tongue down, lips flanged, rhythmical sucking.  Audible Swallowing: Spontaneous and intermittent  Type of Nipple: Everted at rest and after stimulation  Comfort (Breast/Nipple): Soft / non-tender  Hold (Positioning): Assistance needed to correctly position infant at breast and maintain latch.  LATCH Score: 9  Interventions Interventions: Breast feeding basics reviewed;Skin to skin;Assisted with latch;Adjust position;Position options;Support pillows   Consult Status Consult Status: Follow-up from L&D Date: 10/27/21 Follow-up type: In-patient    Shelley Simmons 10/27/2021, 6:48 PM

## 2021-10-27 NOTE — Discharge Summary (Addendum)
Postpartum Discharge Summary       Patient Name: Shelley Simmons DOB: 1996/04/06 MRN: [id number]  Date of admission: 10/27/2021 Delivery date:10/27/2021  Delivering provider: PRAY, Joycelyn Schmid E  Date of discharge: 10/29/2021  Admitting diagnosis: Indication for care in labor and delivery, antepartum [O75.9] Intrauterine pregnancy: [redacted]w[redacted]d    Secondary diagnosis:  Principal Problem:   Indication for care in labor and delivery, antepartum Active Problems:   Normal spontaneous vaginal delivery   Supervision of other normal pregnancy, antepartum  Additional problems: None    Discharge diagnosis: Term Pregnancy Delivered                                              Post partum procedures:None Augmentation: Pitocin Complications: None  Hospital course: Onset of Labor With Vaginal Delivery      25y.o. yo [id number] admitted in Latent Labor on 10/27/2021. Patient had an uncomplicated labor course as follows:  Membrane Rupture Time/Date: 2:00 AM ,10/27/2021   Delivery Method:Vaginal, Spontaneous  Episiotomy: None  Lacerations:  None  Patient had an uncomplicated postpartum course.  She is ambulating, tolerating a regular diet, passing flatus, and urinating well. Patient is discharged home in stable condition on 10/29/21.  Newborn Data: Birth date:10/27/2021  Birth time:5:28 PM  Gender:Female  Living status:Living  Apgars:8 ,9  Weight:2765 g   Magnesium Sulfate received: No BMZ received: No Rhophylac:N/A MMR:N/A T-DaP: given prenatally Flu: N/A Transfusion:No  Physical exam  Vitals:   10/28/21 0858 10/28/21 1457 10/28/21 2014 10/29/21 0637  BP: 106/61 120/77 118/66 108/71  Pulse: 98 91 82 84  Resp: 17   18  Temp: 98.4 F (36.9 C) 98.5 F (36.9 C) 97.9 F (36.6 C) 98.2 F (36.8 C)  TempSrc: Oral Oral Oral Oral  SpO2: 98%  100%   Weight:      Height:       General: alert Lochia: appropriate Uterine Fundus: firm Incision: N/A DVT  Evaluation: No evidence of DVT seen on physical exam. Labs: Lab Results  Component Value Date   WBC 11.6 (H) 10/27/2021   HGB 12.3 10/27/2021   HCT 36.2 10/27/2021   MCV 89.4 10/27/2021   PLT 379 10/27/2021   CMP Latest Ref Rng & Units 07/29/2017  Glucose 65 - 99 mg/dL 106(H)  BUN 6 - 20 mg/dL 9  Creatinine 0.44 - 1.00 mg/dL 0.56  Sodium 135 - 145 mmol/L 136  Potassium 3.5 - 5.1 mmol/L 4.3  Chloride 101 - 111 mmol/L 107  CO2 22 - 32 mmol/L 19(L)  Calcium 8.9 - 10.3 mg/dL 9.0  Total Protein 6.5 - 8.1 g/dL 6.8  Total Bilirubin 0.3 - 1.2 mg/dL 0.6  Alkaline Phos 38 - 126 U/L 168(H)  AST 15 - 41 U/L 22  ALT 14 - 54 U/L 12(L)   Edinburgh Score: Edinburgh Postnatal Depression Scale Screening Tool 10/27/2021  I have been able to laugh and see the funny side of things. 0  I have looked forward with enjoyment to things. 0  I have blamed myself unnecessarily when things went wrong. 0  I have been anxious or worried for no good reason. 0  I have felt scared or panicky for no good reason. 0  Things have been getting on top of me. 0  I have been so unhappy that I have had difficulty sleeping. 0  I have felt sad or miserable. 0  I have been so unhappy that I have been crying. 0  The thought of harming myself has occurred to me. 0  Edinburgh Postnatal Depression Scale Total 0     After visit meds:  Allergies as of 10/29/2021       Reactions   Shrimp [shellfish Allergy] Anaphylaxis        Medication List     TAKE these medications    acetaminophen 325 MG tablet Commonly known as: Tylenol Take 2 tablets (650 mg total) by mouth every 4 (four) hours as needed (for pain scale < 4).   ibuprofen 600 MG tablet Commonly known as: ADVIL Take 1 tablet (600 mg total) by mouth every 6 (six) hours.   norethindrone 0.35 MG tablet Commonly known as: Ortho Micronor Take 1 tablet (0.35 mg total) by mouth daily.   prenatal multivitamin Tabs tablet Take 1 tablet by mouth daily at 12  noon.         Discharge home in stable condition Infant Feeding: Bottle and Breast Infant Disposition:home with mother Discharge instruction: per After Visit Summary and Postpartum booklet. Activity: Advance as tolerated. Pelvic rest for 6 weeks.  Diet: routine diet Future Appointments:No future appointments. Follow up Visit:  Follow-up Information     Department, [facility] Follow up.   Contact information: [address] [place] [address] [phone]                Discussed with patient that she should call to schedule appointment  Please schedule this patient for a In person postpartum visit in 6 weeks with the following provider: MD. Additional Postpartum F/U: None   Low risk pregnancy complicated by:  None Delivery mode:  Vaginal, Spontaneous  Anticipated Birth Control:  POPs  Renard Matter, MD, MPH OB Fellow, Faculty Practice

## 2021-10-27 NOTE — MAU Note (Signed)
..  Shelley Simmons is a 25 y.o. at [redacted]w[redacted]d here in MAU reporting: Contraction since yesterday with bloody show. Reports around 2am she felt with her hands that she was wet, denies any gushes of fluid or continuous leaking. +FM  Pain score: 8/10

## 2021-10-28 DIAGNOSIS — Z3480 Encounter for supervision of other normal pregnancy, unspecified trimester: Secondary | ICD-10-CM

## 2021-10-28 MED ORDER — ACETAMINOPHEN 325 MG PO TABS: 650.0000 mg | ORAL_TABLET | ORAL | 0 refills | Status: DC | PRN

## 2021-10-28 MED ORDER — IBUPROFEN 600 MG PO TABS: 600.0000 mg | ORAL_TABLET | Freq: Four times a day (QID) | ORAL | 0 refills | Status: DC

## 2021-10-28 MED ORDER — NORETHINDRONE 0.35 MG PO TABS: 1.0000 | ORAL_TABLET | Freq: Every day | ORAL | 0 refills | Status: DC

## 2021-10-28 NOTE — Anesthesia Postprocedure Evaluation (Signed)
Anesthesia Post Note  Patient: Shelley Simmons  Procedure(s) Performed: AN AD HOC LABOR EPIDURAL     Patient location during evaluation: Mother Baby Anesthesia Type: Epidural Level of consciousness: awake and alert and oriented Pain management: satisfactory to patient Vital Signs Assessment: post-procedure vital signs reviewed and stable Respiratory status: respiratory function stable Cardiovascular status: stable Postop Assessment: no headache, no backache, epidural receding, patient able to bend at knees, no signs of nausea or vomiting, adequate PO intake and able to ambulate Anesthetic complications: no   No notable events documented.  Last Vitals:  Vitals:   10/28/21 0130 10/28/21 0551  BP: (!) 98/53 107/68  Pulse: 91 98  Resp: 16 18  Temp: 36.8 C 36.7 C  SpO2: 98%     Last Pain:  Vitals:   10/28/21 0735  TempSrc:   PainSc: 0-No pain   Pain Goal: Patients Stated Pain Goal: 0 (10/27/21 0641)                 Karleen Dolphin

## 2021-10-28 NOTE — Lactation Note (Signed)
This note was copied from a baby's chart. Lactation Consultation Note  Patient Name: Shelley Simmons Date: 10/28/2021 Reason for consult: Initial assessment;Term Age:27 hours  LC in to visit with P4 Mom of term baby, weight loss of 2%. Mom holding baby loosely swaddled.  Recommended STS.  Mom reports baby latching well, but is a little sleepy right now.  Reviewed normal newborn feeding behavior and how baby may cluster feed later tonight.  Mom to ask for assistance prn.   Consult Status Consult Status: Follow-up Date: 10/29/21 Follow-up type: In-patient    [name] 10/28/2021, 11:48 AM

## 2021-10-28 NOTE — Progress Notes (Signed)
Post Partum Day 1 Subjective: Doing well. No complaints. Passing gas. Pain well controlled  Objective: Blood pressure 107/68, pulse 98, temperature 98 F (36.7 C), temperature source Oral, resp. rate 18, height 5\' 2"  (1.575 m), weight 98.9 kg, SpO2 98 %, unknown if currently breastfeeding.  Physical Exam:  General: alert Lochia: appropriate Uterine Fundus: firm Incision: N/A DVT Evaluation: No evidence of DVT seen on physical exam.  Recent Labs    10/27/21 0711  HGB 12.3  HCT 36.2    Assessment/Plan: Will plan for DC later today once 24 hours PP (~1730) as long as baby discharged. Meds already sent to patient's pharmacy.   LOS: 1 day   10/29/21 10/28/2021, 6:26 AM

## 2021-10-29 NOTE — Lactation Note (Signed)
This note was copied from a baby's chart. Lactation Consultation Note  Patient Name: Shelley Simmons [id number] Date: 10/29/2021 Reason for consult: Follow-up assessment Age:28 hours  P3, Baby sleeping.   Plan is to breastfeed and supplement after. Mother going to purchase DEBP.  Reviewed engorgement care and monitoring voids/stools. No questions at this time.   Feeding Mother's Current Feeding Choice: Breast Milk and Formula Nipple Type: Slow - flow  Interventions Interventions: Breast feeding basics reviewed;Education;DEBP  Discharge Discharge Education: Engorgement and breast care;Warning signs for feeding baby Pump:  (parents state they plan on purchasing today)  Consult Status Consult Status: Complete Date: 10/29/21    Dahlia Byes [facility] 10/29/2021, 10:37 AM

## 2021-10-29 NOTE — Lactation Note (Signed)
This note was copied from a baby's chart. Lactation Consultation Note RN asked LC to see mom d/t elevated bilirubin and baby less than 6 lbs.  Mom was sitting in recliner BF baby when [facility] came into room. LC spoke w/mom about pumping w/DEBP, mom agreed. LC spoke w/mom about supplementation since baby less than 6lbs and elevated bili. Mom is breast/formula on admission agrees to give formula. Discussed how much to give. Mom states understanding. Experienced BF mom BF her other 2 children 1 yr each. LC set up DEBP. Mom shown how to use DEBP & how to disassemble, clean, & reassemble parts. Mom knows to pump q3h for 15-20 min. Mom encouraged to feed baby 8-12 times/24 hours and with feeding cues.   Encouraged mom to call for questions or concerns.  Patient Name: Shelley Simmons [id number] Date: 10/29/2021 Reason for consult: Follow-up assessment;Infant < 6lbs;Term Age:40 hours  Maternal Data    Feeding Mother's Current Feeding Choice: Breast Milk and Formula  LATCH Score Latch: Grasps breast easily, tongue down, lips flanged, rhythmical sucking.  Audible Swallowing: A few with stimulation  Type of Nipple: Everted at rest and after stimulation  Comfort (Breast/Nipple): Soft / non-tender  Hold (Positioning): No assistance needed to correctly position infant at breast.  LATCH Score: 9   Lactation Tools Discussed/Used Tools: Pump Breast pump type: Double-Electric Breast Pump Pump Education: Setup, frequency, and cleaning;Milk Storage Reason for Pumping: supplementation Pumping frequency: Q3 hrs  Interventions Interventions: Breast feeding basics reviewed;DEBP;Support pillows  Discharge    Consult Status Consult Status: Follow-up Date: 10/29/21 Follow-up type: In-patient    Charyl Dancer 10/29/2021, 12:21 AM

## 2021-11-07 ENCOUNTER — Telehealth (HOSPITAL_COMMUNITY): Payer: Self-pay

## 2021-11-07 NOTE — Telephone Encounter (Signed)
No answer. Voicemail has not been set up yet.  [facility] 11/07/2021,1124

## 2023-12-18 ENCOUNTER — Other Ambulatory Visit: Payer: Self-pay

## 2023-12-18 ENCOUNTER — Emergency Department (HOSPITAL_COMMUNITY)
Admission: EM | Admit: 2023-12-18 | Discharge: 2023-12-19 | Disposition: A | Discharge status: Home / Self Care | Payer: Self-pay | Attending: Emergency Medicine | Admitting: Emergency Medicine

## 2023-12-18 ENCOUNTER — Encounter (HOSPITAL_COMMUNITY): Payer: Self-pay | Admitting: Emergency Medicine

## 2023-12-18 DIAGNOSIS — K219 Gastro-esophageal reflux disease without esophagitis: Secondary | ICD-10-CM | POA: Insufficient documentation

## 2023-12-18 DIAGNOSIS — K8020 Calculus of gallbladder without cholecystitis without obstruction: Secondary | ICD-10-CM | POA: Insufficient documentation

## 2023-12-18 LAB — CBC WITH DIFFERENTIAL/PLATELET
Abs Immature Granulocytes: 0.05 10*3/uL (ref 0.00–0.07)
Basophils Absolute: 0.1 10*3/uL (ref 0.0–0.1)
Basophils Relative: 0 %
Eosinophils Absolute: 0.1 10*3/uL (ref 0.0–0.5)
Eosinophils Relative: 1 %
HCT: 38.3 % (ref 36.0–46.0)
Hemoglobin: 13.3 g/dL (ref 12.0–15.0)
Immature Granulocytes: 0 %
Lymphocytes Relative: 22 %
Lymphs Abs: 3.5 10*3/uL (ref 0.7–4.0)
MCH: 30.4 pg (ref 26.0–34.0)
MCHC: 34.7 g/dL (ref 30.0–36.0)
MCV: 87.6 fL (ref 80.0–100.0)
Monocytes Absolute: 0.8 10*3/uL (ref 0.1–1.0)
Monocytes Relative: 5 %
Neutro Abs: 11.4 10*3/uL — ABNORMAL HIGH (ref 1.7–7.7)
Neutrophils Relative %: 72 %
Platelets: 414 10*3/uL — ABNORMAL HIGH (ref 150–400)
RBC: 4.37 MIL/uL (ref 3.87–5.11)
RDW: 13.2 % (ref 11.5–15.5)
WBC: 16 10*3/uL — ABNORMAL HIGH (ref 4.0–10.5)
nRBC: 0 % (ref 0.0–0.2)

## 2023-12-18 LAB — URINALYSIS, ROUTINE W REFLEX MICROSCOPIC
Glucose, UA: NEGATIVE mg/dL
Ketones, ur: 5 mg/dL — AB
Nitrite: NEGATIVE
Protein, ur: 100 mg/dL — AB
Specific Gravity, Urine: 1.042 — ABNORMAL HIGH (ref 1.005–1.030)
pH: 5 (ref 5.0–8.0)

## 2023-12-18 LAB — COMPREHENSIVE METABOLIC PANEL
ALT: 33 U/L (ref 0–44)
AST: 25 U/L (ref 15–41)
Albumin: 3.9 g/dL (ref 3.5–5.0)
Alkaline Phosphatase: 69 U/L (ref 38–126)
Anion gap: 11 (ref 5–15)
BUN: 10 mg/dL (ref 6–20)
CO2: 24 mmol/L (ref 22–32)
Calcium: 9.4 mg/dL (ref 8.9–10.3)
Chloride: 107 mmol/L (ref 98–111)
Creatinine, Ser: 0.67 mg/dL (ref 0.44–1.00)
GFR, Estimated: >60 mL/min (ref >60)
Glucose, Bld: 102 mg/dL — ABNORMAL HIGH (ref 70–99)
Potassium: 3.8 mmol/L (ref 3.5–5.1)
Sodium: 142 mmol/L (ref 135–145)
Total Bilirubin: 0.5 mg/dL (ref 0.0–1.2)
Total Protein: 7.9 g/dL (ref 6.5–8.1)

## 2023-12-18 LAB — HCG, SERUM, QUALITATIVE: Preg, Serum: NEGATIVE

## 2023-12-18 LAB — LIPASE, BLOOD: Lipase: 45 U/L (ref 11–51)

## 2023-12-18 NOTE — ED Triage Notes (Signed)
 Pt reports upper ABD pain x 2 weeks that has increased in frequency over the past few days. Endorses N/V but denies diarrhea. Denies any CP or SOB.

## 2023-12-19 ENCOUNTER — Emergency Department (HOSPITAL_COMMUNITY): Payer: Self-pay

## 2023-12-19 MED ORDER — IOHEXOL 350 MG/ML SOLN
75.0000 mL | Freq: Once | INTRAVENOUS | Status: AC | PRN
  Administered 2023-12-19: 75 mL via INTRAVENOUS

## 2023-12-19 MED ORDER — ALUM & MAG HYDROXIDE-SIMETH 200-200-20 MG/5ML PO SUSP
30.0000 mL | Freq: Once | ORAL | Status: AC
  Administered 2023-12-19: 30 mL via ORAL
  Filled 2023-12-19: qty 30

## 2023-12-19 MED ORDER — OMEPRAZOLE 20 MG PO CPDR: 20.0000 mg | DELAYED_RELEASE_CAPSULE | Freq: Every day | ORAL | 0 refills | Status: DC

## 2023-12-19 MED ORDER — DICYCLOMINE HCL 20 MG PO TABS: 20.0000 mg | ORAL_TABLET | Freq: Two times a day (BID) | ORAL | 0 refills | Status: DC

## 2023-12-19 MED ORDER — ONDANSETRON HCL 4 MG/2ML IJ SOLN
4.0000 mg | Freq: Once | INTRAMUSCULAR | Status: AC
  Administered 2023-12-19: 4 mg via INTRAVENOUS
  Filled 2023-12-19: qty 2

## 2023-12-19 MED ORDER — DICYCLOMINE HCL 10 MG PO CAPS
10.0000 mg | ORAL_CAPSULE | Freq: Once | ORAL | Status: AC
  Administered 2023-12-19: 10 mg via ORAL
  Filled 2023-12-19: qty 1

## 2023-12-19 MED ORDER — KETOROLAC TROMETHAMINE 30 MG/ML IJ SOLN
30.0000 mg | Freq: Once | INTRAMUSCULAR | Status: AC
  Administered 2023-12-19: 30 mg via INTRAVENOUS
  Filled 2023-12-19: qty 1

## 2023-12-19 NOTE — ED Notes (Signed)
 Patient transported to CT scan .

## 2023-12-19 NOTE — ED Provider Notes (Signed)
 [place] EMERGENCY DEPARTMENT AT [facility] Provider Note   CSN: [id number] Arrival date & time: 12/18/23  2136     History  Chief Complaint  Patient presents with   Abdominal Pain    Shelley Simmons is a 28 y.o. female.  The history is provided by the patient.  Abdominal Pain Pain location:  Epigastric Pain quality: shooting   Pain radiates to:  Chest Pain severity:  Severe Timing:  Constant Progression:  Unchanged Chronicity:  New Context: not trauma   Relieved by:  Nothing Worsened by:  Nothing Ineffective treatments:  None tried Associated symptoms: no fever, no shortness of breath, no sore throat, no vaginal bleeding and no vaginal discharge        Home Medications Prior to Admission medications   Medication Sig Start Date End Date Taking? Authorizing Provider  dicyclomine  (BENTYL ) 20 MG tablet Take 1 tablet (20 mg total) by mouth 2 (two) times daily. 12/19/23  Yes Reyden Smith, MD  omeprazole  (PRILOSEC) 20 MG capsule Take 1 capsule (20 mg total) by mouth daily. 12/19/23  Yes Ladonya Jerkins, MD  acetaminophen  (TYLENOL ) 325 MG tablet Take 2 tablets (650 mg total) by mouth every 4 (four) hours as needed (for pain scale < 4). 10/28/21   Virgilio Payor, MD  ibuprofen  (ADVIL ) 600 MG tablet Take 1 tablet (600 mg total) by mouth every 6 (six) hours. 10/28/21   Virgilio Payor, MD  norethindrone  (ORTHO MICRONOR ) 0.35 MG tablet Take 1 tablet (0.35 mg total) by mouth daily. 10/28/21 01/26/22  Das, Anuka, MD  Prenatal Vit-Fe Fumarate-FA (PRENATAL MULTIVITAMIN) TABS tablet Take 1 tablet by mouth daily at 12 noon.    [provider]      Allergies    Shrimp [shellfish allergy]    Review of Systems   Review of Systems  Constitutional:  Negative for fever.  HENT:  Negative for sore throat.   Respiratory:  Negative for shortness of breath.   Gastrointestinal:  Positive for abdominal pain.  Genitourinary:  Negative for vaginal bleeding and vaginal discharge.   All other systems reviewed and are negative.   Physical Exam Updated Vital Signs BP 123/78 (BP Location: Left Arm)   Pulse 82   Temp 98.1 F (36.7 C) (Oral)   Resp 20   SpO2 100%  Physical Exam Vitals and nursing note reviewed.  Constitutional:      General: She is not in acute distress.    Appearance: She is well-developed.  HENT:     Head: Normocephalic and atraumatic.     Nose: Nose normal.  Eyes:     Pupils: Pupils are equal, round, and reactive to light.  Cardiovascular:     Rate and Rhythm: Normal rate and regular rhythm.     Pulses: Normal pulses.     Heart sounds: Normal heart sounds.  Pulmonary:     Effort: Pulmonary effort is normal. No respiratory distress.     Breath sounds: Normal breath sounds.  Abdominal:     General: Bowel sounds are normal. There is no distension.     Palpations: Abdomen is soft.     Tenderness: There is no abdominal tenderness. There is no guarding or rebound. Negative signs include Murphy's sign and McBurney's sign.  Musculoskeletal:        General: Normal range of motion.     Cervical back: Neck supple.  Skin:    General: Skin is warm and dry.     Capillary Refill: Capillary refill takes less  than 2 seconds.     Findings: No erythema or rash.  Neurological:     General: No focal deficit present.     Deep Tendon Reflexes: Reflexes normal.  Psychiatric:        Mood and Affect: Mood normal.     ED Results / Procedures / Treatments   Labs (all labs ordered are listed, but only abnormal results are displayed) Results for orders placed or performed during the hospital encounter of 12/18/23  Comprehensive metabolic panel   Collection Time: 12/18/23 10:05 PM  Result Value Ref Range   Sodium 142 135 - 145 mmol/L   Potassium 3.8 3.5 - 5.1 mmol/L   Chloride 107 98 - 111 mmol/L   CO2 24 22 - 32 mmol/L   Glucose, Bld 102 (H) 70 - 99 mg/dL   BUN 10 6 - 20 mg/dL   Creatinine, Ser 9.32 0.44 - 1.00 mg/dL   Calcium 9.4 8.9 - 89.6 mg/dL    Total Protein 7.9 6.5 - 8.1 g/dL   Albumin 3.9 3.5 - 5.0 g/dL   AST 25 15 - 41 U/L   ALT 33 0 - 44 U/L   Alkaline Phosphatase 69 38 - 126 U/L   Total Bilirubin 0.5 0.0 - 1.2 mg/dL   GFR, Estimated >39 >39 mL/min   Anion gap 11 5 - 15  Lipase, blood   Collection Time: 12/18/23 10:05 PM  Result Value Ref Range   Lipase 45 11 - 51 U/L  CBC with Diff   Collection Time: 12/18/23 10:05 PM  Result Value Ref Range   WBC 16.0 (H) 4.0 - 10.5 K/uL   RBC 4.37 3.87 - 5.11 MIL/uL   Hemoglobin 13.3 12.0 - 15.0 g/dL   HCT 61.6 63.9 - 53.9 %   MCV 87.6 80.0 - 100.0 fL   MCH 30.4 26.0 - 34.0 pg   MCHC 34.7 30.0 - 36.0 g/dL   RDW 86.7 88.4 - 84.4 %   Platelets 414 (H) 150 - 400 K/uL   nRBC 0.0 0.0 - 0.2 %   Neutrophils Relative % 72 %   Neutro Abs 11.4 (H) 1.7 - 7.7 K/uL   Lymphocytes Relative 22 %   Lymphs Abs 3.5 0.7 - 4.0 K/uL   Monocytes Relative 5 %   Monocytes Absolute 0.8 0.1 - 1.0 K/uL   Eosinophils Relative 1 %   Eosinophils Absolute 0.1 0.0 - 0.5 K/uL   Basophils Relative 0 %   Basophils Absolute 0.1 0.0 - 0.1 K/uL   Immature Granulocytes 0 %   Abs Immature Granulocytes 0.05 0.00 - 0.07 K/uL  hCG, serum, qualitative   Collection Time: 12/18/23 10:05 PM  Result Value Ref Range   Preg, Serum NEGATIVE NEGATIVE  Urinalysis, Routine w reflex microscopic -Urine, Clean Catch   Collection Time: 12/18/23 10:12 PM  Result Value Ref Range   Color, Urine AMBER (A) YELLOW   APPearance HAZY (A) CLEAR   Specific Gravity, Urine 1.042 (H) 1.005 - 1.030   pH 5.0 5.0 - 8.0   Glucose, UA NEGATIVE NEGATIVE mg/dL   Hgb urine dipstick SMALL (A) NEGATIVE   Bilirubin Urine SMALL (A) NEGATIVE   Ketones, ur 5 (A) NEGATIVE mg/dL   Protein, ur 899 (A) NEGATIVE mg/dL   Nitrite NEGATIVE NEGATIVE   Leukocytes,Ua TRACE (A) NEGATIVE   RBC / HPF 6-10 0 - 5 RBC/hpf   WBC, UA 0-5 0 - 5 WBC/hpf   Bacteria, UA FEW (A) NONE SEEN   Squamous Epithelial /  HPF 6-10 0 - 5 /HPF   Mucus PRESENT    Hyaline  Casts, UA PRESENT    CT ABDOMEN PELVIS W CONTRAST Result Date: 12/19/2023 CLINICAL DATA:  Status post trauma. EXAM: CT ABDOMEN AND PELVIS WITH CONTRAST TECHNIQUE: Multidetector CT imaging of the abdomen and pelvis was performed using the standard protocol following bolus administration of intravenous contrast. RADIATION DOSE REDUCTION: This exam was performed according to the departmental dose-optimization program which includes automated exposure control, adjustment of the mA and/or kV according to patient size and/or use of iterative reconstruction technique. CONTRAST:  75mL OMNIPAQUE  IOHEXOL  350 MG/ML SOLN COMPARISON:  None Available. FINDINGS: Lower chest: No acute abnormality. Hepatobiliary: No focal liver abnormality is seen. Numerous small gallstones are seen within the gallbladder lumen, without evidence of gallbladder wall thickening or biliary dilatation. Pancreas: Unremarkable. No pancreatic ductal dilatation or surrounding inflammatory changes. Spleen: Normal in size without focal abnormality. Adrenals/Urinary Tract: Adrenal glands are unremarkable. Kidneys are normal, without renal calculi, focal lesion, or hydronephrosis. Bladder is unremarkable. Stomach/Bowel: Stomach is within normal limits. Appendix appears normal. No evidence of bowel wall thickening, distention, or inflammatory changes. Vascular/Lymphatic: No significant vascular findings are present. No enlarged abdominal or pelvic lymph nodes. Reproductive: Uterus and bilateral adnexa are unremarkable. Other: No abdominal wall hernia or abnormality. No abdominopelvic ascites. Musculoskeletal: Benign-appearing sclerotic changes are seen involving the bilateral iliac bones along the bilateral sacroiliac joints. No acute or significant osseous findings. IMPRESSION: Cholelithiasis. Electronically Signed   By: Suzen Dials M.D.   On: 12/19/2023 01:49     EKG EKG Interpretation Date/Time:  Saturday December 18 2023 21:57:14  EST Ventricular Rate:  108 PR Interval:  138 QRS Duration:  90 QT Interval:  356 QTC Calculation: 477 R Axis:   75  Text Interpretation: Sinus tachycardia Confirmed by Deryck Hippler ([id number]) on 12/19/2023 12:40:09 AM  Radiology CT ABDOMEN PELVIS W CONTRAST Result Date: 12/19/2023 CLINICAL DATA:  Status post trauma. EXAM: CT ABDOMEN AND PELVIS WITH CONTRAST TECHNIQUE: Multidetector CT imaging of the abdomen and pelvis was performed using the standard protocol following bolus administration of intravenous contrast. RADIATION DOSE REDUCTION: This exam was performed according to the departmental dose-optimization program which includes automated exposure control, adjustment of the mA and/or kV according to patient size and/or use of iterative reconstruction technique. CONTRAST:  75mL OMNIPAQUE  IOHEXOL  350 MG/ML SOLN COMPARISON:  None Available. FINDINGS: Lower chest: No acute abnormality. Hepatobiliary: No focal liver abnormality is seen. Numerous small gallstones are seen within the gallbladder lumen, without evidence of gallbladder wall thickening or biliary dilatation. Pancreas: Unremarkable. No pancreatic ductal dilatation or surrounding inflammatory changes. Spleen: Normal in size without focal abnormality. Adrenals/Urinary Tract: Adrenal glands are unremarkable. Kidneys are normal, without renal calculi, focal lesion, or hydronephrosis. Bladder is unremarkable. Stomach/Bowel: Stomach is within normal limits. Appendix appears normal. No evidence of bowel wall thickening, distention, or inflammatory changes. Vascular/Lymphatic: No significant vascular findings are present. No enlarged abdominal or pelvic lymph nodes. Reproductive: Uterus and bilateral adnexa are unremarkable. Other: No abdominal wall hernia or abnormality. No abdominopelvic ascites. Musculoskeletal: Benign-appearing sclerotic changes are seen involving the bilateral iliac bones along the bilateral sacroiliac joints. No acute or significant  osseous findings. IMPRESSION: Cholelithiasis. Electronically Signed   By: Suzen Dials M.D.   On: 12/19/2023 01:49    Procedures Procedures    Medications Ordered in ED Medications  ondansetron  (ZOFRAN ) injection 4 mg (4 mg Intravenous Given 12/19/23 0104)  alum & mag hydroxide-simeth (MAALOX/MYLANTA) 200-200-20 MG/5ML suspension 30 mL (  30 mLs Oral Given 12/19/23 0104)  dicyclomine  (BENTYL ) capsule 10 mg (10 mg Oral Given 12/19/23 0104)  ketorolac  (TORADOL ) 30 MG/ML injection 30 mg (30 mg Intravenous Given 12/19/23 0104)  iohexol  (OMNIPAQUE ) 350 MG/ML injection 75 mL (75 mLs Intravenous Contrast Given 12/19/23 0121)    ED Course/ Medical Decision Making/ A&P                                 Medical Decision Making Patient with epigastric pain x 2 weeks worse with greasy food   Amount and/or Complexity of Data Reviewed External Data Reviewed: notes.    Details: Previous notes reviewed  Labs: ordered.    Details: Urine negative for UTI, white count slight elevated 16, normal hemoglobin 13.3, normal platelets.  Normal sodium 142, normal potassium 3.8, normal creatinine.  Normal LFTs  Radiology: ordered and independent interpretation performed.    Details: Negative CT by me   Risk OTC drugs. Prescription drug management. Risk Details: Patient is well appearing with normal exam.  Exam is not consistent with acute cholecystitis. Symptoms consistent with gerd.  But there are gall stones on CT.  Will need to follow up with surgery for elective removal.  Bland diet instructions given.  Stable for discharge.  Strict returns.      Final Clinical Impression(s) / ED Diagnoses Final diagnoses:  Calculus of gallbladder without cholecystitis without obstruction  Gastroesophageal reflux disease without esophagitis    I have reviewed the triage vital signs and the nursing notes. Pertinent labs & imaging results that were available during my care of the patient were reviewed by me and considered  in my medical decision making (see chart for details). After history, exam, and medical workup I feel the patient has been appropriately medically screened and is safe for discharge home. Pertinent diagnoses were discussed with the patient. Patient was given return precautions.  Rx / DC Orders ED Discharge Orders          Ordered    omeprazole  (PRILOSEC) 20 MG capsule  Daily        12/19/23 0226    dicyclomine  (BENTYL ) 20 MG tablet  2 times daily        12/19/23 0226              Kendyll Huettner, MD 12/19/23 0321

## 2024-02-08 ENCOUNTER — Encounter (HOSPITAL_COMMUNITY): Payer: Self-pay

## 2024-02-08 NOTE — Progress Notes (Signed)
 Surgical Instructions    Your procedure is scheduled on Monday, 02/14/24.  Report to [facility] Main Entrance "A" at 6:45 A.M., then check in with the Admitting office.  Call this number if you have problems the morning of surgery:  [phone]   If you have any questions prior to your surgery date call [phone]: Open Monday-Friday 8am-4pm If you experience any cold or flu symptoms such as cough, fever, chills, shortness of breath, etc. between now and your scheduled surgery, please notify us at the above number     Remember:  Do not eat or drink after midnight the night before your surgery- Sunday.     Take these medicines the morning of surgery with A SIP OF WATER:  acetaminophen (TYLENOL)  if needed dicyclomine (BENTYL)  norethindrone (ORTHO MICRONOR)  omeprazole (PRILOSEC)    As of today, STOP taking any Aspirin (unless otherwise instructed by your surgeon) Aleve, Naproxen, Ibuprofen, Motrin, Advil, Goody's, BC's, all herbal medications, fish oil, and all vitamins.           Do not wear jewelry or makeup. Do not wear lotions, powders, perfumes or deodorant. Do not shave 48 hours prior to surgery.   Do not bring valuables to the hospital. Do not wear nail polish, gel polish, artificial nails, or any other type of covering on natural nails (fingers and toes) If you have artificial nails or gel coating that need to be removed by a nail salon, please have this removed prior to surgery. Artificial nails or gel coating may interfere with anesthesia's ability to adequately monitor your vital signs.  [facility] is not responsible for any belongings or valuables.    Contacts, glasses, hearing aids, dentures or partials may not be worn into surgery, please bring cases for these belongings   Patients discharged the day of surgery will not be allowed to drive home, and someone needs to stay with them for 24 hours.   SURGICAL WAITING ROOM VISITATION Patients having surgery or a  procedure may have no more than 2 support people in the waiting area - these visitors may rotate.   Children under the age of 74 must have an adult with them who is not the patient. If the patient needs to stay at the hospital during part of their recovery, the visitor guidelines for inpatient rooms apply. Pre-op nurse will coordinate an appropriate time for 1 support person to accompany patient in pre-op.  This support person may not rotate.   Please refer to [email or web address] for the visitor guidelines for Inpatients (after your surgery is over and you are in a regular room).    Special instructions:    Oral Hygiene is also important to reduce your risk of infection.  Remember - BRUSH YOUR TEETH THE MORNING OF SURGERY WITH YOUR REGULAR TOOTHPASTE   [place]- Preparing For Surgery  Before surgery, you can play an important role. Because skin is not sterile, your skin needs to be as free of germs as possible. You can reduce the number of germs on your skin by washing with CHG (chlorahexidine gluconate) Soap before surgery.  CHG is an antiseptic cleaner which kills germs and bonds with the skin to continue killing germs even after washing.     Please do not use if you have an allergy to CHG or antibacterial soaps. If your skin becomes reddened/irritated stop using the CHG.  Do not shave (including legs and underarms) for at least 48 hours prior to first CHG shower. It  is OK to shave your face.  Please follow these instructions carefully.     Shower the [employer] [facility]-Sun and the MORNING OF SURGERY-Mon with CHG Soap.   If you chose to wash your hair, wash your hair first as usual with your normal shampoo. After you shampoo, rinse your hair and body thoroughly to remove the shampoo.  Then [employer] and genitals (private parts) with your normal soap and rinse thoroughly to remove soap.  After that Use CHG Soap as you would any other  liquid soap. You can apply CHG directly to the skin and wash gently with a scrungie or a clean washcloth.   Apply the CHG Soap to your body ONLY FROM THE NECK DOWN.  Do not use on open wounds or open sores. Avoid contact with your eyes, ears, mouth and genitals (private parts). Wash Face and genitals (private parts)  with your normal soap.   Wash thoroughly, paying special attention to the area where your surgery will be performed.  Thoroughly rinse your body with warm water from the neck down.  DO NOT shower/wash with your normal soap after using and rinsing off the CHG Soap.  Pat yourself dry with a CLEAN TOWEL.  Wear CLEAN PAJAMAS to bed the night before surgery  Place CLEAN SHEETS on your bed the night before your surgery  DO NOT SLEEP WITH PETS.   Day of Surgery:  Take a shower with CHG soap. Wear Clean/Comfortable clothing the morning of surgery Do not apply any deodorants/lotions.   Remember to brush your teeth WITH YOUR REGULAR TOOTHPASTE.    If you received a COVID test during your pre-op visit, it is requested that you wear a mask when out in public, stay away from anyone that may not be feeling well, and notify your surgeon if you develop symptoms. If you have been in contact with anyone that has tested positive in the last 10 days, please notify your surgeon.    Please read over the following fact sheets that you were given.

## 2024-02-08 NOTE — Progress Notes (Signed)
 Error

## 2024-02-09 ENCOUNTER — Encounter (HOSPITAL_COMMUNITY)
Admission: RE | Admit: 2024-02-09 | Discharge: 2024-02-09 | Disposition: A | Discharge status: Home / Self Care | Payer: Self-pay | Source: Ambulatory Visit | Attending: General Surgery | Admitting: General Surgery

## 2024-02-09 ENCOUNTER — Encounter (HOSPITAL_COMMUNITY): Payer: Self-pay | Admitting: *Deleted

## 2024-02-09 ENCOUNTER — Other Ambulatory Visit: Payer: Self-pay

## 2024-02-09 VITALS — BP 109/79 | HR 73 | Temp 97.6°F | Resp 17 | Ht 62.0 in | Wt 198.2 lb

## 2024-02-09 DIAGNOSIS — Z01818 Encounter for other preprocedural examination: Secondary | ICD-10-CM

## 2024-02-09 DIAGNOSIS — Z01812 Encounter for preprocedural laboratory examination: Secondary | ICD-10-CM | POA: Insufficient documentation

## 2024-02-09 LAB — CBC
HCT: 38.7 % (ref 36.0–46.0)
Hemoglobin: 12.8 g/dL (ref 12.0–15.0)
MCH: 29.4 pg (ref 26.0–34.0)
MCHC: 33.1 g/dL (ref 30.0–36.0)
MCV: 89 fL (ref 80.0–100.0)
Platelets: 412 10*3/uL — ABNORMAL HIGH (ref 150–400)
RBC: 4.35 MIL/uL (ref 3.87–5.11)
RDW: 13.2 % (ref 11.5–15.5)
WBC: 10 10*3/uL (ref 4.0–10.5)
nRBC: 0 % (ref 0.0–0.2)

## 2024-02-09 NOTE — Progress Notes (Signed)
 PCP - denies Cardiologist - denies  PPM/ICD - denies   Chest x-ray - denies EKG - 12/18/23 Stress Test - denies ECHO - denies Cardiac Cath - denies  Sleep Study - denies   DM- denies  Last dose of GLP1 agonist-  n/a   ASA/Blood Thinner Instructions: n/a   ERAS Protcol - clears until 0545   COVID TEST- n/a   Anesthesia review: no  Patient denies shortness of breath, fever, cough and chest pain at PAT appointment   All instructions explained to the patient, with a verbal understanding of the material. Patient agrees to go over the instructions while at home for a better understanding. The opportunity to ask questions was provided.

## 2024-02-09 NOTE — Progress Notes (Signed)
 Surgical Instructions    Your procedure is scheduled on Monday, 02/14/24.  Report to [facility] Main Entrance "A" at 6:45 A.M., then check in with the Admitting office.  Call this number if you have problems the morning of surgery:  [phone]   If you have any questions prior to your surgery date call [phone]: Open Monday-Friday 8am-4pm If you experience any cold or flu symptoms such as cough, fever, chills, shortness of breath, etc. between now and your scheduled surgery, please notify us at the above number     Remember:  Do not eat or drink after midnight the night before your surgery- Sunday.     Take these medicines the morning of surgery with A SIP OF WATER: none    As of today, STOP taking any Aspirin (unless otherwise instructed by your surgeon) Aleve, Naproxen, Ibuprofen, Motrin, Advil, Goody's, BC's, all herbal medications, fish oil, and all vitamins.           Do not wear jewelry or makeup. Do not wear lotions, powders, perfumes or deodorant. Do not shave 48 hours prior to surgery.   Do not bring valuables to the hospital. Do not wear nail polish, gel polish, artificial nails, or any other type of covering on natural nails (fingers and toes) If you have artificial nails or gel coating that need to be removed by a nail salon, please have this removed prior to surgery. Artificial nails or gel coating may interfere with anesthesia's ability to adequately monitor your vital signs.  [facility] is not responsible for any belongings or valuables.    Contacts, glasses, hearing aids, dentures or partials may not be worn into surgery, please bring cases for these belongings   Patients discharged the day of surgery will not be allowed to drive home, and someone needs to stay with them for 24 hours.   SURGICAL WAITING ROOM VISITATION Patients having surgery or a procedure may have no more than 2 support people in the waiting area - these visitors may rotate.   Children  under the age of 62 must have an adult with them who is not the patient. If the patient needs to stay at the hospital during part of their recovery, the visitor guidelines for inpatient rooms apply. Pre-op nurse will coordinate an appropriate time for 1 support person to accompany patient in pre-op.  This support person may not rotate.   Please refer to [email or web address] for the visitor guidelines for Inpatients (after your surgery is over and you are in a regular room).    Special instructions:    Oral Hygiene is also important to reduce your risk of infection.  Remember - BRUSH YOUR TEETH THE MORNING OF SURGERY WITH YOUR REGULAR TOOTHPASTE   [place]- Preparing For Surgery  Before surgery, you can play an important role. Because skin is not sterile, your skin needs to be as free of germs as possible. You can reduce the number of germs on your skin by washing with CHG (chlorahexidine gluconate) Soap before surgery.  CHG is an antiseptic cleaner which kills germs and bonds with the skin to continue killing germs even after washing.     Please do not use if you have an allergy to CHG or antibacterial soaps. If your skin becomes reddened/irritated stop using the CHG.  Do not shave (including legs and underarms) for at least 48 hours prior to first CHG shower. It is OK to shave your face.  Please follow these instructions carefully.  Shower the [employer] [facility]-Sun and the MORNING OF SURGERY-Mon with CHG Soap.   If you chose to wash your hair, wash your hair first as usual with your normal shampoo. After you shampoo, rinse your hair and body thoroughly to remove the shampoo.  Then [employer] and genitals (private parts) with your normal soap and rinse thoroughly to remove soap.  After that Use CHG Soap as you would any other liquid soap. You can apply CHG directly to the skin and wash gently with a scrungie or a clean washcloth.    Apply the CHG Soap to your body ONLY FROM THE NECK DOWN.  Do not use on open wounds or open sores. Avoid contact with your eyes, ears, mouth and genitals (private parts). Wash Face and genitals (private parts)  with your normal soap.   Wash thoroughly, paying special attention to the area where your surgery will be performed.  Thoroughly rinse your body with warm water from the neck down.  DO NOT shower/wash with your normal soap after using and rinsing off the CHG Soap.  Pat yourself dry with a CLEAN TOWEL.  Wear CLEAN PAJAMAS to bed the night before surgery  Place CLEAN SHEETS on your bed the night before your surgery  DO NOT SLEEP WITH PETS.   Day of Surgery:  Take a shower with CHG soap. Wear Clean/Comfortable clothing the morning of surgery Do not apply any deodorants/lotions.   Remember to brush your teeth WITH YOUR REGULAR TOOTHPASTE.    If you received a COVID test during your pre-op visit, it is requested that you wear a mask when out in public, stay away from anyone that may not be feeling well, and notify your surgeon if you develop symptoms. If you have been in contact with anyone that has tested positive in the last 10 days, please notify your surgeon.    Please read over the following fact sheets that you were given.

## 2024-02-09 NOTE — Pre-Procedure Instructions (Signed)
 Surgical Instructions   Your procedure is scheduled on Monday, April 7th. Report to [facility] Main Entrance "A" at 06:45 A.M., then check in with the Admitting office. Any questions or running late day of surgery: call [phone]  Questions prior to your surgery date: call [phone], Monday-Friday, 8am-4pm. If you experience any cold or flu symptoms such as cough, fever, chills, shortness of breath, etc. between now and your scheduled surgery, please notify us at the above number.     Remember:  Do not eat after midnight the night before your surgery   You may drink clear liquids until 05:45 AM the morning of your surgery.   Clear liquids allowed are: Water, Non-Citrus Juices (without pulp), Carbonated Beverages, Clear Tea (no milk, honey, etc.), Black Coffee Only (NO MILK, CREAM OR POWDERED CREAMER of any kind), and Gatorade.    Take these medicines the morning of surgery with A SIP OF WATER: NONE    One week prior to surgery, STOP taking any Aspirin (unless otherwise instructed by your surgeon) Aleve, Naproxen, Ibuprofen, Motrin, Advil, Goody's, BC's, all herbal medications, fish oil, and non-prescription vitamins.                     Do NOT Smoke (Tobacco/Vaping) for 24 hours prior to your procedure.  If you use a CPAP at night, you may bring your mask/headgear for your overnight stay.   You will be asked to remove any contacts, glasses, piercing's, hearing aid's, dentures/partials prior to surgery. Please bring cases for these items if needed.    Patients discharged the day of surgery will not be allowed to drive home, and someone needs to stay with them for 24 hours.  SURGICAL WAITING ROOM VISITATION Patients may have no more than 2 support people in the waiting area - these visitors may rotate.   Pre-op nurse will coordinate an appropriate time for 1 ADULT support person, who may not rotate, to accompany patient in pre-op.  Children under the age of 6 must have an  adult with them who is not the patient and must remain in the main waiting area with an adult.  If the patient needs to stay at the hospital during part of their recovery, the visitor guidelines for inpatient rooms apply.  Please refer to the [facility] website for the visitor guidelines for any additional information.   If you received a COVID test during your pre-op visit  it is requested that you wear a mask when out in public, stay away from anyone that may not be feeling well and notify your surgeon if you develop symptoms. If you have been in contact with anyone that has tested positive in the last 10 days please notify you surgeon.      Pre-operative CHG Bathing Instructions   You can play a key role in reducing the risk of infection after surgery. Your skin needs to be as free of germs as possible. You can reduce the number of germs on your skin by washing with CHG (chlorhexidine gluconate) soap before surgery. CHG is an antiseptic soap that kills germs and continues to kill germs even after washing.   DO NOT use if you have an allergy to chlorhexidine/CHG or antibacterial soaps. If your skin becomes reddened or irritated, stop using the CHG and notify one of our RNs at [phone].              TAKE A SHOWER THE NIGHT BEFORE SURGERY AND THE DAY OF  SURGERY    Please keep in mind the following:  DO NOT shave, including legs and underarms, 48 hours prior to surgery.   You may shave your face before/day of surgery.  Place clean sheets on your bed the night before surgery Use a clean washcloth (not used since being washed) for each shower. DO NOT sleep with pet's night before surgery.  CHG Shower Instructions:  Wash your face and private area with normal soap. If you choose to wash your hair, wash first with your normal shampoo.  After you use shampoo/soap, rinse your hair and body thoroughly to remove shampoo/soap residue.  Turn the water OFF and apply half the bottle of CHG soap  to a CLEAN washcloth.  Apply CHG soap ONLY FROM YOUR NECK DOWN TO YOUR TOES (washing for 3-5 minutes)  DO NOT use CHG soap on face, private areas, open wounds, or sores.  Pay special attention to the area where your surgery is being performed.  If you are having back surgery, having someone wash your back for you may be helpful. Wait 2 minutes after CHG soap is applied, then you may rinse off the CHG soap.  Pat dry with a clean towel  Put on clean pajamas    Additional instructions for the day of surgery: DO NOT APPLY any lotions, deodorants, cologne, or perfumes.   Do not wear jewelry or makeup Do not wear nail polish, gel polish, artificial nails, or any other type of covering on natural nails (fingers and toes) Do not bring valuables to the hospital. [facility] is not responsible for valuables/personal belongings. Put on clean/comfortable clothes.  Please brush your teeth.  Ask your nurse before applying any prescription medications to the skin.

## 2024-02-13 NOTE — Anesthesia Preprocedure Evaluation (Signed)
 Anesthesia Evaluation  Patient identified by MRN, date of birth, ID band Patient awake    Reviewed: Allergy & Precautions, NPO status , Patient's Chart, lab work & pertinent test results  Airway Mallampati: II  TM Distance: >3 FB Neck ROM: Full    Dental no notable dental hx. (+) Teeth Intact, Dental Advisory Given   Pulmonary neg pulmonary ROS   Pulmonary exam normal breath sounds clear to auscultation       Cardiovascular negative cardio ROS Normal cardiovascular exam Rhythm:Regular Rate:Normal     Neuro/Psych negative neurological ROS  negative psych ROS   GI/Hepatic negative GI ROS, Neg liver ROS,,,  Endo/Other  negative endocrine ROS  Obese BMI 36  Renal/GU negative Renal ROS  negative genitourinary   Musculoskeletal negative musculoskeletal ROS (+)    Abdominal   Peds  Hematology negative hematology ROS (+)   Anesthesia Other Findings   Reproductive/Obstetrics                             Anesthesia Physical Anesthesia Plan  ASA: 2  Anesthesia Plan: General   Post-op Pain Management: Tylenol PO (pre-op)*   Induction: Intravenous  PONV Risk Score and Plan: 3 and Midazolam, Dexamethasone and Ondansetron  Airway Management Planned: Oral ETT  Additional Equipment:   Intra-op Plan:   Post-operative Plan: Extubation in OR  Informed Consent: I have reviewed the patients History and Physical, chart, labs and discussed the procedure including the risks, benefits and alternatives for the proposed anesthesia with the patient or authorized representative who has indicated his/her understanding and acceptance.     Dental advisory given  Plan Discussed with: CRNA  Anesthesia Plan Comments:        Anesthesia Quick Evaluation

## 2024-02-14 ENCOUNTER — Other Ambulatory Visit: Payer: Self-pay

## 2024-02-14 ENCOUNTER — Ambulatory Visit (HOSPITAL_COMMUNITY)
Admission: RE | Admit: 2024-02-14 | Discharge: 2024-02-14 | Disposition: A | Discharge status: Home / Self Care | Payer: Self-pay | Attending: General Surgery | Admitting: General Surgery

## 2024-02-14 ENCOUNTER — Encounter (HOSPITAL_COMMUNITY)
Admission: RE | Disposition: A | Discharge status: Home / Self Care | Payer: Self-pay | Source: Home / Self Care | Attending: General Surgery

## 2024-02-14 ENCOUNTER — Ambulatory Visit (HOSPITAL_COMMUNITY): Payer: Self-pay | Admitting: Anesthesiology

## 2024-02-14 ENCOUNTER — Ambulatory Visit (HOSPITAL_BASED_OUTPATIENT_CLINIC_OR_DEPARTMENT_OTHER): Payer: Self-pay | Admitting: Anesthesiology

## 2024-02-14 ENCOUNTER — Encounter (HOSPITAL_COMMUNITY): Payer: Self-pay | Admitting: General Surgery

## 2024-02-14 DIAGNOSIS — K8020 Calculus of gallbladder without cholecystitis without obstruction: Secondary | ICD-10-CM

## 2024-02-14 DIAGNOSIS — K8010 Calculus of gallbladder with chronic cholecystitis without obstruction: Secondary | ICD-10-CM | POA: Insufficient documentation

## 2024-02-14 DIAGNOSIS — Z6836 Body mass index (BMI) 36.0-36.9, adult: Secondary | ICD-10-CM | POA: Insufficient documentation

## 2024-02-14 DIAGNOSIS — E669 Obesity, unspecified: Secondary | ICD-10-CM | POA: Insufficient documentation

## 2024-02-14 DIAGNOSIS — Z01818 Encounter for other preprocedural examination: Secondary | ICD-10-CM

## 2024-02-14 HISTORY — PX: CHOLECYSTECTOMY: SHX55

## 2024-02-14 LAB — POCT PREGNANCY, URINE: Preg Test, Ur: NEGATIVE

## 2024-02-14 SURGERY — LAPAROSCOPIC CHOLECYSTECTOMY
Anesthesia: General

## 2024-02-14 MED ORDER — OXYCODONE HCL 5 MG PO TABS
ORAL_TABLET | ORAL | Status: AC
  Filled 2024-02-14: qty 1

## 2024-02-14 MED ORDER — ACETAMINOPHEN 500 MG PO TABS
1000.0000 mg | ORAL_TABLET | Freq: Once | ORAL | Status: AC
  Administered 2024-02-14: 1000 mg via ORAL
  Filled 2024-02-14: qty 2

## 2024-02-14 MED ORDER — FENTANYL CITRATE (PF) 100 MCG/2ML IJ SOLN: INTRAMUSCULAR | Status: DC | PRN

## 2024-02-14 MED ORDER — 0.9 % SODIUM CHLORIDE (POUR BTL) OPTIME
TOPICAL | Status: DC | PRN
  Administered 2024-02-14: 1000 mL

## 2024-02-14 MED ORDER — ACETAMINOPHEN 500 MG PO TABS
1000.0000 mg | ORAL_TABLET | Freq: Three times a day (TID) | ORAL | Status: AC
Start: 2024-02-14 — End: 2024-02-19

## 2024-02-14 MED ORDER — FENTANYL CITRATE (PF) 100 MCG/2ML IJ SOLN
INTRAMUSCULAR | Status: AC
  Filled 2024-02-14: qty 2

## 2024-02-14 MED ORDER — ONDANSETRON HCL 4 MG/2ML IJ SOLN
INTRAMUSCULAR | Status: DC | PRN
  Administered 2024-02-14: 4 mg via INTRAVENOUS

## 2024-02-14 MED ORDER — BUPIVACAINE-EPINEPHRINE (PF) 0.25% -1:200000 IJ SOLN
INTRAMUSCULAR | Status: AC
  Filled 2024-02-14: qty 30

## 2024-02-14 MED ORDER — OXYCODONE HCL 5 MG PO TABS: 5.0000 mg | ORAL_TABLET | Freq: Four times a day (QID) | ORAL | 0 refills | Status: AC | PRN

## 2024-02-14 MED ORDER — PROPOFOL 10 MG/ML IV BOLUS
INTRAVENOUS | Status: DC | PRN
  Administered 2024-02-14: 150 mg via INTRAVENOUS

## 2024-02-14 MED ORDER — DEXAMETHASONE SODIUM PHOSPHATE 10 MG/ML IJ SOLN
INTRAMUSCULAR | Status: DC | PRN
  Administered 2024-02-14: 8 mg via INTRAVENOUS

## 2024-02-14 MED ORDER — STERILE WATER FOR IRRIGATION IR SOLN
Status: DC | PRN
Start: 2024-02-14 — End: 2024-02-14
  Administered 2024-02-14: 1000 mL

## 2024-02-14 MED ORDER — ROCURONIUM BROMIDE 10 MG/ML (PF) SYRINGE
PREFILLED_SYRINGE | INTRAVENOUS | Status: AC
  Filled 2024-02-14: qty 10

## 2024-02-14 MED ORDER — OXYCODONE HCL 5 MG/5ML PO SOLN: 5.0000 mg | Freq: Once | ORAL | Status: AC | PRN

## 2024-02-14 MED ORDER — SODIUM CHLORIDE 0.9 % IR SOLN
Status: DC | PRN
  Administered 2024-02-14: 1000 mL

## 2024-02-14 MED ORDER — ROCURONIUM BROMIDE 100 MG/10ML IV SOLN
INTRAVENOUS | Status: DC | PRN
  Administered 2024-02-14: 50 mg via INTRAVENOUS

## 2024-02-14 MED ORDER — ORAL CARE MOUTH RINSE: 15.0000 mL | Freq: Once | OROMUCOSAL | Status: AC

## 2024-02-14 MED ORDER — FENTANYL CITRATE (PF) 250 MCG/5ML IJ SOLN
INTRAMUSCULAR | Status: AC
  Filled 2024-02-14: qty 5

## 2024-02-14 MED ORDER — PROPOFOL 10 MG/ML IV BOLUS
INTRAVENOUS | Status: AC
  Filled 2024-02-14: qty 20

## 2024-02-14 MED ORDER — ONDANSETRON HCL 4 MG/2ML IJ SOLN
INTRAMUSCULAR | Status: AC
  Filled 2024-02-14: qty 2

## 2024-02-14 MED ORDER — OXYCODONE HCL 5 MG PO TABS
5.0000 mg | ORAL_TABLET | Freq: Once | ORAL | Status: AC | PRN
  Administered 2024-02-14: 5 mg via ORAL

## 2024-02-14 MED ORDER — DEXAMETHASONE SODIUM PHOSPHATE 10 MG/ML IJ SOLN
INTRAMUSCULAR | Status: AC
  Filled 2024-02-14: qty 1

## 2024-02-14 MED ORDER — LIDOCAINE 2% (20 MG/ML) 5 ML SYRINGE
INTRAMUSCULAR | Status: AC
  Filled 2024-02-14: qty 5

## 2024-02-14 MED ORDER — EPHEDRINE SULFATE (PRESSORS) 50 MG/ML IJ SOLN
INTRAMUSCULAR | Status: DC | PRN
  Administered 2024-02-14: 10 mg via INTRAVENOUS

## 2024-02-14 MED ORDER — SUGAMMADEX SODIUM 200 MG/2ML IV SOLN
INTRAVENOUS | Status: DC | PRN
  Administered 2024-02-14: 200 mg via INTRAVENOUS

## 2024-02-14 MED ORDER — FENTANYL CITRATE (PF) 100 MCG/2ML IJ SOLN
25.0000 ug | INTRAMUSCULAR | Status: DC | PRN
  Administered 2024-02-14 (×3): 50 ug via INTRAVENOUS

## 2024-02-14 MED ORDER — AMISULPRIDE (ANTIEMETIC) 5 MG/2ML IV SOLN: 10.0000 mg | Freq: Once | INTRAVENOUS | Status: DC | PRN

## 2024-02-14 MED ORDER — FENTANYL CITRATE (PF) 100 MCG/2ML IJ SOLN
INTRAMUSCULAR | Status: DC | PRN
  Administered 2024-02-14 (×3): 50 ug via INTRAVENOUS
  Administered 2024-02-14: 100 ug via INTRAVENOUS

## 2024-02-14 MED ORDER — SODIUM CHLORIDE 0.9 % IV SOLN
2.0000 g | Freq: Two times a day (BID) | INTRAVENOUS | Status: DC
  Administered 2024-02-14: 2 g via INTRAVENOUS
  Filled 2024-02-14 (×2): qty 2

## 2024-02-14 MED ORDER — MIDAZOLAM HCL 2 MG/2ML IJ SOLN
INTRAMUSCULAR | Status: AC
  Filled 2024-02-14: qty 2

## 2024-02-14 MED ORDER — LIDOCAINE 2% (20 MG/ML) 5 ML SYRINGE
INTRAMUSCULAR | Status: DC | PRN
  Administered 2024-02-14: 80 mg via INTRAVENOUS

## 2024-02-14 MED ORDER — EPHEDRINE 5 MG/ML INJ
INTRAVENOUS | Status: AC
  Filled 2024-02-14: qty 5

## 2024-02-14 MED ORDER — CHLORHEXIDINE GLUCONATE 0.12 % MT SOLN
15.0000 mL | Freq: Once | OROMUCOSAL | Status: AC
  Administered 2024-02-14: 15 mL via OROMUCOSAL
  Filled 2024-02-14: qty 15

## 2024-02-14 MED ORDER — BUPIVACAINE-EPINEPHRINE 0.25% -1:200000 IJ SOLN
INTRAMUSCULAR | Status: DC | PRN
  Administered 2024-02-14: 30 mL

## 2024-02-14 MED ORDER — MIDAZOLAM HCL 2 MG/2ML IJ SOLN
INTRAMUSCULAR | Status: DC | PRN
  Administered 2024-02-14: 2 mg via INTRAVENOUS

## 2024-02-14 MED ORDER — LACTATED RINGERS IV SOLN: INTRAVENOUS | Status: DC

## 2024-02-14 MED ORDER — FENTANYL CITRATE (PF) 100 MCG/2ML IJ SOLN
INTRAMUSCULAR | Status: DC
Start: 2024-02-14 — End: 2024-02-14
  Filled 2024-02-14: qty 2

## 2024-02-14 SURGICAL SUPPLY — 38 items
APPLIER CLIP 5 13 M/L LIGAMAX5 (MISCELLANEOUS) ×1 IMPLANT
BAG COUNTER SPONGE SURGICOUNT (BAG) ×1 IMPLANT
BENZOIN TINCTURE PRP APPL 2/3 (GAUZE/BANDAGES/DRESSINGS) ×1 IMPLANT
BLADE CLIPPER SURG (BLADE) IMPLANT
BNDG ADH 1X3 SHEER STRL LF (GAUZE/BANDAGES/DRESSINGS) ×3 IMPLANT
CANISTER SUCT 3000ML PPV (MISCELLANEOUS) ×1 IMPLANT
CHLORAPREP W/TINT 26 (MISCELLANEOUS) ×1 IMPLANT
CLIP APPLIE 5 13 M/L LIGAMAX5 (MISCELLANEOUS) ×1 IMPLANT
COVER SURGICAL LIGHT HANDLE (MISCELLANEOUS) ×1 IMPLANT
DERMABOND ADVANCED .7 DNX12 (GAUZE/BANDAGES/DRESSINGS) IMPLANT
ELECT REM PT RETURN 9FT ADLT (ELECTROSURGICAL) ×1 IMPLANT
ELECTRODE REM PT RTRN 9FT ADLT (ELECTROSURGICAL) ×1 IMPLANT
GAUZE SPONGE 2X2 8PLY STRL LF (GAUZE/BANDAGES/DRESSINGS) ×1 IMPLANT
GLOVE PI ORTHO PRO STRL 7.5 (GLOVE) ×1 IMPLANT
GOWN STRL REUS W/ TWL LRG LVL3 (GOWN DISPOSABLE) ×2 IMPLANT
GOWN STRL REUS W/ TWL XL LVL3 (GOWN DISPOSABLE) ×1 IMPLANT
GRASPER SUT TROCAR 14GX15 (MISCELLANEOUS) IMPLANT
IRRIG SUCT STRYKERFLOW 2 WTIP (MISCELLANEOUS) ×1 IMPLANT
IRRIGATION SUCT STRKRFLW 2 WTP (MISCELLANEOUS) ×1 IMPLANT
KIT BASIN OR (CUSTOM PROCEDURE TRAY) ×1 IMPLANT
KIT IMAGING PINPOINTPAQ (MISCELLANEOUS) IMPLANT
KIT TURNOVER KIT B (KITS) ×1 IMPLANT
NS IRRIG 1000ML POUR BTL (IV SOLUTION) ×1 IMPLANT
PAD ARMBOARD POSITIONER FOAM (MISCELLANEOUS) ×1 IMPLANT
POUCH RETRIEVAL ECOSAC 10 (ENDOMECHANICALS) ×1 IMPLANT
SCISSORS LAP 5X35 DISP (ENDOMECHANICALS) ×1 IMPLANT
SET TUBE SMOKE EVAC HIGH FLOW (TUBING) ×1 IMPLANT
SLEEVE ADV FIXATION 5X100MM (TROCAR) ×2 IMPLANT
SPECIMEN JAR SMALL (MISCELLANEOUS) ×1 IMPLANT
SUT MNCRL AB 4-0 PS2 18 (SUTURE) ×1 IMPLANT
SUT VICRYL 0 UR6 27IN ABS (SUTURE) IMPLANT
TOWEL GREEN STERILE (TOWEL DISPOSABLE) ×1 IMPLANT
TOWEL GREEN STERILE FF (TOWEL DISPOSABLE) ×1 IMPLANT
TRAY LAPAROSCOPIC MC (CUSTOM PROCEDURE TRAY) ×1 IMPLANT
TROCAR ADV FIXATION 5X100MM (TROCAR) ×1 IMPLANT
TROCAR BALLN 12MMX100 BLUNT (TROCAR) ×1 IMPLANT
WARMER LAPAROSCOPE (MISCELLANEOUS) ×1 IMPLANT
WATER STERILE IRR 1000ML POUR (IV SOLUTION) ×1 IMPLANT

## 2024-02-14 NOTE — Transfer of Care (Signed)
 Immediate Anesthesia Transfer of Care Note  Patient: Shelley Simmons  Procedure(s) Performed: LAPAROSCOPIC CHOLECYSTECTOMY  Patient Location: PACU  Anesthesia Type:General  Level of Consciousness: awake, alert , and oriented  Airway & Oxygen Therapy: Patient Spontanous Breathing and Patient connected to face mask oxygen  Post-op Assessment: Report given to RN and Post -op Vital signs reviewed and stable  Post vital signs: Reviewed and stable  Last Vitals:  Vitals Value Taken Time  BP 114/62 02/14/24 1104  Temp    Pulse 88 02/14/24 1107  Resp 16 02/14/24 1107  SpO2 100 % 02/14/24 1107  Vitals shown include unfiled device data.  Last Pain:  Vitals:   02/14/24 0716  TempSrc:   PainSc: 0-No pain         Complications: No notable events documented.

## 2024-02-14 NOTE — H&P (Signed)
 CC: here for surgery  Requesting provider: n/a  HPI: Shelley Simmons is an 28 y.o. female who is here for lap chole. Denies any changes since seen in clinic. No trips to ED/hospital/new meds/new med dx.   Old hpi: Shelley Simmons is a 28 y.o. female who is seen today as an office consultation at the request of Dr. Room for evaluation of New Patient (Eval gb) .  History of Present Illness Shelley Simmons is a 29 year old female who presents with gallbladder issues.  She experiences severe abdominal pain localized under her right rib cage, radiating to her right back. The pain initially felt like gas but progressively worsened, leading her to seek emergency care on February 8th. The episodes have been increasing in duration, with the most recent lasting approximately five hours.  During these episodes, she experiences nausea and vomiting, often resulting in a 'watery mouth' when there is nothing left to vomit. She has not identified a consistent pattern to the pain, although she did experience an episode after consuming a small amount of fat during dinner.  She has not undergone any prior abdominal surgeries and does not take prescription medications daily. However, she has been prescribed dicyclomine and omeprazole, and she took dicyclomine2 during a recent episode on the past Sunday.  She has been mindful of her diet, maintaining a low-fat intake, which she believes has helped manage her symptoms to some extent.   Past Medical History:  Diagnosis Date   Medical history non-contributory     Past Surgical History:  Procedure Laterality Date   NO PAST SURGERIES      Family History  Problem Relation Age of Onset   Healthy Mother    Healthy Father     Social:  reports that she has never smoked. She has never used smokeless tobacco. She reports that she does not drink alcohol and does not use drugs.  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   ROS - all of the below systems have been reviewed with the patient and positives are indicated with bold text General: chills, fever or night sweats Eyes: blurry vision or double vision ENT: epistaxis or sore throat Allergy/Immunology: itchy/watery eyes or nasal congestion Hematologic/Lymphatic: bleeding problems, blood clots or swollen lymph nodes Endocrine: temperature intolerance or unexpected weight changes Breast: new or changing breast lumps or nipple discharge Resp: cough, shortness of breath, or wheezing CV: chest pain or dyspnea on exertion GI: as per HPI GU: dysuria, trouble voiding, or hematuria MSK: joint pain or joint stiffness Neuro: TIA or stroke symptoms Derm: pruritus and skin lesion changes Psych: anxiety and depression  PE Blood pressure 126/81, pulse 83, temperature 98.1 F (36.7 C), temperature source Oral, resp. rate 18, height 5\' 2"  (1.575 m), weight 89.8 kg, last menstrual period 01/24/2024, SpO2 97%, unknown if currently breastfeeding. Constitutional: NAD; conversant; no deformities Eyes: Moist conjunctiva; no lid lag; anicteric; PERRL Neck: Trachea midline; no thyromegaly Lungs: Normal respiratory effort; no tactile fremitus CV: RRR; no palpable thrills; no pitting edema GI: Abd soft, nt; no palpable hepatosplenomegaly MSK: Normal gait; no clubbing/cyanosis Psychiatric: Appropriate affect; alert and oriented x3 Lymphatic: No palpable cervical or axillary lymphadenopathy Skin:no rash  Results for orders placed or performed during the hospital encounter of 02/14/24 (from the past 48 hours)  Pregnancy, urine POC     Status: None   Collection Time: 02/14/24  7:29 AM  Result Value Ref Range   Preg Test, Ur NEGATIVE NEGATIVE    Comment:        THE SENSITIVITY OF THIS METHODOLOGY IS >24 mIU/mL     No results found.  Imaging: Abdominal CT: Cholelithiasis  (12/18/2023) ED visit notes Labs 12/18/23   A/P: Shelley Simmons is an 28 y.o. female with  Symptomatic cholelithiasis  To OR for lap chole  IV abx All questions asked and answered  Shelley Simmons. Andrey Campanile, MD, FACS General, Bariatric, & Minimally Invasive Surgery [facility]

## 2024-02-14 NOTE — Anesthesia Procedure Notes (Signed)
 Procedure Name: Intubation Date/Time: 02/14/2024 9:44 AM  Performed by: Ovidio Kin, CRNAPre-anesthesia Checklist: Patient identified, Emergency Drugs available, Suction available and Patient being monitored Patient Re-evaluated:Patient Re-evaluated prior to induction Oxygen Delivery Method: Circle system utilized Preoxygenation: Pre-oxygenation with 100% oxygen Induction Type: IV induction Ventilation: Mask ventilation without difficulty Laryngoscope Size: Mac and 3 Grade View: Grade I Tube type: Oral Tube size: 7.0 mm Number of attempts: 1 Airway Equipment and Method: Stylet Placement Confirmation: ETT inserted through vocal cords under direct vision, positive ETCO2, CO2 detector and breath sounds checked- equal and bilateral Secured at: 20 cm Tube secured with: Tape Dental Injury: Teeth and Oropharynx as per pre-operative assessment

## 2024-02-14 NOTE — Discharge Instructions (Signed)

## 2024-02-14 NOTE — Anesthesia Postprocedure Evaluation (Signed)
 Anesthesia Post Note  Patient: Zenita Kister  Procedure(s) Performed: LAPAROSCOPIC CHOLECYSTECTOMY     Patient location during evaluation: PACU Anesthesia Type: General Level of consciousness: awake and alert Pain management: pain level controlled Vital Signs Assessment: post-procedure vital signs reviewed and stable Respiratory status: spontaneous breathing, nonlabored ventilation, respiratory function stable and patient connected to nasal cannula oxygen Cardiovascular status: blood pressure returned to baseline and stable Postop Assessment: no apparent nausea or vomiting Anesthetic complications: no  No notable events documented.  Last Vitals:  Vitals:   02/14/24 1130 02/14/24 1145  BP: 114/77 108/71  Pulse: 77 73  Resp: 19 (!) 22  Temp:  36.7 C  SpO2: 99% 100%    Last Pain:  Vitals:   02/14/24 1145  TempSrc:   PainSc: 2                  Cherylee Rawlinson L Eren Puebla

## 2024-02-14 NOTE — Op Note (Signed)
 Shelley Simmons [id number] [birth year] 02/14/2024  Laparoscopic Cholecystectomy Procedure Note  Indications: This patient presents with symptomatic gallbladder disease and will undergo laparoscopic cholecystectomy.  Pre-operative Diagnosis: symptomatic cholelithiasis   Post-operative Diagnosis: same  Surgeon: Gaynelle Adu MD FACS  Assistants: none  Anesthesia: General endotracheal anesthesia   Procedure Details  The patient was seen again in the Holding Room. The risks, benefits, complications, treatment options, and expected outcomes were discussed with the patient. The possibilities of reaction to medication, pulmonary aspiration, perforation of viscus, bleeding, recurrent infection, finding a normal gallbladder, the need for additional procedures, failure to diagnose a condition, the possible need to convert to an open procedure, and creating a complication requiring transfusion or operation were discussed with the patient. The likelihood of improving the patient's symptoms with return to their baseline status is good.  The patient and/or family concurred with the proposed plan, giving informed consent. The site of surgery properly noted. The patient was taken to Operating Room, identified as Shelley Simmons and the procedure verified as Laparoscopic Cholecystectomy. A Time Out was held and the above information confirmed. Antibiotic prophylaxis was administered.   Prior to the induction of general anesthesia, antibiotic prophylaxis was administered. General endotracheal anesthesia was then administered and tolerated well. After the induction, the abdomen was prepped with Chloraprep and draped in the sterile fashion. The patient was positioned in the supine position.  Local anesthetic agent was injected into the skin near the umbilicus and an incision made. We dissected down to the abdominal fascia with blunt dissection.  The fascia was incised vertically and we entered the  peritoneal cavity bluntly.  A pursestring suture of 0-Vicryl was placed around the fascial opening.  The Hasson cannula was inserted and secured with the stay suture.  Pneumoperitoneum was then created with CO2 and tolerated well without any adverse changes in the patient's vital signs. An 5-mm port was placed in the subxiphoid position.  Two 5-mm ports were placed in the right upper quadrant. All skin incisions were infiltrated with a local anesthetic agent before making the incision and placing the trocars.   We positioned the patient in reverse Trendelenburg, tilted slightly to the patient's left.  The gallbladder was identified, the fundus grasped and retracted cephalad. Adhesions were lysed bluntly and with the electrocautery where indicated, taking care not to injure any adjacent organs or viscus. The infundibulum was grasped and retracted laterally, exposing the peritoneum overlying the triangle of Calot. This was then divided and exposed in a blunt fashion. A critical view of the cystic duct and cystic artery was obtained.  The cystic duct was clearly identified and bluntly dissected circumferentially. The patient had a short cystic duct. I could clearly see it entering the common hepatic duct to form the common bile duct.    The cystic duct was then ligated with clips and divided. The cystic artery which had been identified & dissected free was ligated with clips and divided as well.   The gallbladder was dissected from the liver bed in retrograde fashion with the electrocautery. The gallbladder was removed and placed in an Ecco sac.  The gallbladder and Ecco sac were then removed through the umbilical port site. The liver bed was irrigated and inspected. Hemostasis was achieved with the electrocautery. Copious irrigation was utilized and was repeatedly aspirated until clear.  The pursestring suture was used to close the umbilical fascia.  An additional interrupted 0 Vicryl was placed at the umbilical  fascia with PMI suture passer with  laparoscopic guidance.  We again inspected the right upper quadrant for hemostasis.  The umbilical closure was inspected and there was no air leak and nothing trapped within the closure. Pneumoperitoneum was released as we removed the trocars.  4-0 Monocryl was used to close the skin.   Dermabond was applied. The patient was then extubated and brought to the recovery room in stable condition. Instrument, sponge, and needle counts were correct at closure and at the conclusion of the case.   Findings: Cholelithiasis Mild fatty liver +critical view  Estimated Blood Loss: Minimal         Drains: none         Specimens: Gallbladder           Complications: None; patient tolerated the procedure well.         Disposition: PACU - hemodynamically stable.         Condition: stable  [name]. Andrey Campanile, MD, FACS General, Bariatric, & Minimally Invasive Surgery [facility]  [facility]

## 2024-02-15 ENCOUNTER — Encounter (HOSPITAL_COMMUNITY): Payer: Self-pay | Admitting: General Surgery

## 2024-02-15 LAB — SURGICAL PATHOLOGY
